# Patient Record
Sex: Female | Born: 1950 | Race: Black or African American | Hispanic: No | Marital: Married | Smoking: Former smoker
Health system: Southern US, Community
[De-identification: ages and names within clinical notes are randomized; demographics above are authoritative.]

## PROBLEM LIST (undated history)

## (undated) DIAGNOSIS — R51 Headache: Secondary | ICD-10-CM

## (undated) DIAGNOSIS — T7840XA Allergy, unspecified, initial encounter: Secondary | ICD-10-CM

## (undated) DIAGNOSIS — M8580 Other specified disorders of bone density and structure, unspecified site: Secondary | ICD-10-CM

## (undated) DIAGNOSIS — I10 Essential (primary) hypertension: Secondary | ICD-10-CM

## (undated) DIAGNOSIS — K219 Gastro-esophageal reflux disease without esophagitis: Secondary | ICD-10-CM

## (undated) DIAGNOSIS — J40 Bronchitis, not specified as acute or chronic: Secondary | ICD-10-CM

## (undated) DIAGNOSIS — D649 Anemia, unspecified: Secondary | ICD-10-CM

## (undated) HISTORY — DX: Headache: R51

## (undated) HISTORY — DX: Anemia, unspecified: D64.9

## (undated) HISTORY — PX: BREAST LUMPECTOMY: SHX2

## (undated) HISTORY — DX: Other specified disorders of bone density and structure, unspecified site: M85.80

## (undated) HISTORY — PX: LAPAROSCOPIC HYSTERECTOMY: SHX1926

## (undated) HISTORY — PX: TUBAL LIGATION: SHX77

## (undated) HISTORY — DX: Essential (primary) hypertension: I10

## (undated) HISTORY — DX: Allergy, unspecified, initial encounter: T78.40XA

## (undated) HISTORY — DX: Gastro-esophageal reflux disease without esophagitis: K21.9

## (undated) HISTORY — DX: Bronchitis, not specified as acute or chronic: J40

---

## 1998-02-18 ENCOUNTER — Other Ambulatory Visit: Admission: RE | Admit: 1998-02-18 | Discharge: 1998-02-18 | Payer: Self-pay | Admitting: *Deleted

## 2001-05-27 ENCOUNTER — Other Ambulatory Visit: Admission: RE | Admit: 2001-05-27 | Discharge: 2001-05-27 | Payer: Self-pay | Admitting: *Deleted

## 2001-12-05 ENCOUNTER — Emergency Department (HOSPITAL_COMMUNITY): Admission: EM | Admit: 2001-12-05 | Discharge: 2001-12-05 | Payer: Self-pay | Admitting: *Deleted

## 2001-12-05 ENCOUNTER — Encounter: Payer: Self-pay | Admitting: *Deleted

## 2003-10-12 ENCOUNTER — Ambulatory Visit (HOSPITAL_COMMUNITY): Admission: RE | Admit: 2003-10-12 | Discharge: 2003-10-12 | Payer: Self-pay | Admitting: Obstetrics & Gynecology

## 2004-09-10 ENCOUNTER — Ambulatory Visit: Payer: Self-pay | Admitting: Gastroenterology

## 2004-09-15 ENCOUNTER — Ambulatory Visit: Payer: Self-pay | Admitting: Gastroenterology

## 2004-10-01 ENCOUNTER — Ambulatory Visit: Payer: Self-pay

## 2004-10-30 ENCOUNTER — Ambulatory Visit: Payer: Self-pay | Admitting: Gastroenterology

## 2004-10-30 ENCOUNTER — Ambulatory Visit: Payer: Self-pay

## 2004-12-10 ENCOUNTER — Ambulatory Visit: Payer: Self-pay | Admitting: Gastroenterology

## 2004-12-17 ENCOUNTER — Ambulatory Visit: Payer: Self-pay | Admitting: Gastroenterology

## 2005-09-09 ENCOUNTER — Other Ambulatory Visit: Admission: RE | Admit: 2005-09-09 | Discharge: 2005-09-09 | Payer: Self-pay | Admitting: Obstetrics and Gynecology

## 2005-10-07 ENCOUNTER — Ambulatory Visit (HOSPITAL_COMMUNITY): Admission: RE | Admit: 2005-10-07 | Discharge: 2005-10-07 | Payer: Self-pay | Admitting: Obstetrics and Gynecology

## 2005-10-08 ENCOUNTER — Encounter: Payer: Self-pay | Admitting: Internal Medicine

## 2005-10-23 ENCOUNTER — Ambulatory Visit: Payer: Self-pay | Admitting: Internal Medicine

## 2005-10-28 ENCOUNTER — Ambulatory Visit: Payer: Self-pay | Admitting: Internal Medicine

## 2005-11-13 ENCOUNTER — Ambulatory Visit: Payer: Self-pay | Admitting: Gastroenterology

## 2005-11-25 ENCOUNTER — Ambulatory Visit: Payer: Self-pay | Admitting: Internal Medicine

## 2005-11-26 ENCOUNTER — Ambulatory Visit: Payer: Self-pay | Admitting: Gastroenterology

## 2005-12-09 ENCOUNTER — Ambulatory Visit: Payer: Self-pay | Admitting: Gastroenterology

## 2005-12-22 ENCOUNTER — Ambulatory Visit: Payer: Self-pay | Admitting: Internal Medicine

## 2006-01-05 ENCOUNTER — Ambulatory Visit: Payer: Self-pay | Admitting: Internal Medicine

## 2006-01-06 ENCOUNTER — Ambulatory Visit: Payer: Self-pay | Admitting: Internal Medicine

## 2006-01-18 ENCOUNTER — Ambulatory Visit: Payer: Self-pay | Admitting: Internal Medicine

## 2006-02-01 ENCOUNTER — Ambulatory Visit: Payer: Self-pay | Admitting: Gastroenterology

## 2006-02-09 ENCOUNTER — Ambulatory Visit: Payer: Self-pay | Admitting: Internal Medicine

## 2006-05-10 ENCOUNTER — Ambulatory Visit: Payer: Self-pay | Admitting: Internal Medicine

## 2006-07-13 ENCOUNTER — Ambulatory Visit: Payer: Self-pay | Admitting: Internal Medicine

## 2006-07-27 ENCOUNTER — Ambulatory Visit: Payer: Self-pay | Admitting: Internal Medicine

## 2006-09-10 ENCOUNTER — Other Ambulatory Visit: Admission: RE | Admit: 2006-09-10 | Discharge: 2006-09-10 | Payer: Self-pay | Admitting: Obstetrics and Gynecology

## 2006-09-13 ENCOUNTER — Ambulatory Visit: Payer: Self-pay | Admitting: Internal Medicine

## 2006-09-13 LAB — CONVERTED CEMR LAB
Basophils Absolute: 0.3 10*3/uL — ABNORMAL HIGH (ref 0.0–0.1)
Basophils Relative: 5.1 % — ABNORMAL HIGH (ref 0.0–1.0)
Eosinophil percent: 2.7 % (ref 0.0–5.0)
HCT: 36.7 % (ref 36.0–46.0)
Hemoglobin: 12 g/dL (ref 12.0–15.0)
Lymphocytes Relative: 41.1 % (ref 12.0–46.0)
MCHC: 32.7 g/dL (ref 30.0–36.0)
MCV: 89.4 fL (ref 78.0–100.0)
Monocytes Absolute: 0.5 10*3/uL (ref 0.2–0.7)
Monocytes Relative: 10.5 % (ref 3.0–11.0)
Neutro Abs: 2.1 10*3/uL (ref 1.4–7.7)
Neutrophils Relative %: 40.6 % — ABNORMAL LOW (ref 43.0–77.0)
Platelets: 458 10*3/uL — ABNORMAL HIGH (ref 150–400)
RBC: 4.11 M/uL (ref 3.87–5.11)
RDW: 16.3 % — ABNORMAL HIGH (ref 11.5–14.6)
WBC: 5.1 10*3/uL (ref 4.5–10.5)

## 2006-10-11 ENCOUNTER — Ambulatory Visit (HOSPITAL_COMMUNITY): Admission: RE | Admit: 2006-10-11 | Discharge: 2006-10-11 | Payer: Self-pay | Admitting: Obstetrics and Gynecology

## 2007-01-13 ENCOUNTER — Ambulatory Visit: Payer: Self-pay | Admitting: Internal Medicine

## 2007-02-24 ENCOUNTER — Ambulatory Visit: Payer: Self-pay | Admitting: Gastroenterology

## 2007-04-25 ENCOUNTER — Ambulatory Visit: Payer: Self-pay | Admitting: Internal Medicine

## 2007-04-25 LAB — CONVERTED CEMR LAB
BUN: 7 mg/dL (ref 6–23)
Basophils Absolute: 0.1 10*3/uL (ref 0.0–0.1)
Basophils Relative: 1.3 % — ABNORMAL HIGH (ref 0.0–1.0)
Creatinine, Ser: 0.6 mg/dL (ref 0.4–1.2)
Eosinophils Relative: 2.9 % (ref 0.0–5.0)
Glucose, Bld: 86 mg/dL (ref 70–99)
Lymphocytes Relative: 27 % (ref 12.0–46.0)
MCV: 91.1 fL (ref 78.0–100.0)
Monocytes Absolute: 0.5 10*3/uL (ref 0.2–0.7)
Monocytes Relative: 7.2 % (ref 3.0–11.0)
Neutro Abs: 4 10*3/uL (ref 1.4–7.7)
Neutrophils Relative %: 61.6 % (ref 43.0–77.0)
Platelets: 458 10*3/uL — ABNORMAL HIGH (ref 150–400)
Potassium: 4.1 meq/L (ref 3.5–5.1)
Pro B Natriuretic peptide (BNP): 169 pg/mL — ABNORMAL HIGH (ref 0.0–100.0)
RDW: 12.6 % (ref 11.5–14.6)
TSH: 1.56 microintl units/mL (ref 0.35–5.50)

## 2007-06-04 ENCOUNTER — Encounter: Payer: Self-pay | Admitting: Internal Medicine

## 2007-06-04 DIAGNOSIS — K219 Gastro-esophageal reflux disease without esophagitis: Secondary | ICD-10-CM

## 2007-06-04 DIAGNOSIS — I10 Essential (primary) hypertension: Secondary | ICD-10-CM

## 2007-06-04 DIAGNOSIS — J309 Allergic rhinitis, unspecified: Secondary | ICD-10-CM | POA: Insufficient documentation

## 2007-07-01 ENCOUNTER — Ambulatory Visit: Payer: Self-pay | Admitting: Internal Medicine

## 2007-11-08 ENCOUNTER — Ambulatory Visit: Payer: Self-pay | Admitting: Internal Medicine

## 2007-11-16 ENCOUNTER — Ambulatory Visit (HOSPITAL_COMMUNITY): Admission: RE | Admit: 2007-11-16 | Discharge: 2007-11-16 | Payer: Self-pay | Admitting: Obstetrics and Gynecology

## 2007-11-21 ENCOUNTER — Ambulatory Visit: Payer: Self-pay | Admitting: Internal Medicine

## 2007-11-22 ENCOUNTER — Other Ambulatory Visit: Admission: RE | Admit: 2007-11-22 | Discharge: 2007-11-22 | Payer: Self-pay | Admitting: Obstetrics and Gynecology

## 2007-11-23 ENCOUNTER — Encounter: Admission: RE | Admit: 2007-11-23 | Discharge: 2007-11-23 | Payer: Self-pay | Admitting: Obstetrics and Gynecology

## 2007-11-24 ENCOUNTER — Telehealth: Payer: Self-pay | Admitting: Internal Medicine

## 2008-01-23 LAB — CONVERTED CEMR LAB: Pap Smear: NORMAL

## 2008-01-27 ENCOUNTER — Ambulatory Visit: Payer: Self-pay | Admitting: Internal Medicine

## 2008-02-01 ENCOUNTER — Ambulatory Visit: Payer: Self-pay | Admitting: Internal Medicine

## 2008-02-01 LAB — CONVERTED CEMR LAB
ALT: 15 units/L (ref 0–35)
AST: 15 units/L (ref 0–37)
BUN: 7 mg/dL (ref 6–23)
GFR calc Af Amer: 112 mL/min
GFR calc non Af Amer: 92 mL/min
Glucose, Bld: 78 mg/dL (ref 70–99)
Sodium: 136 meq/L (ref 135–145)
TSH: 0.87 microintl units/mL (ref 0.35–5.50)

## 2008-02-06 ENCOUNTER — Encounter: Payer: Self-pay | Admitting: Internal Medicine

## 2008-02-06 ENCOUNTER — Ambulatory Visit: Payer: Self-pay

## 2008-02-09 ENCOUNTER — Telehealth: Payer: Self-pay | Admitting: Internal Medicine

## 2008-02-15 ENCOUNTER — Telehealth (INDEPENDENT_AMBULATORY_CARE_PROVIDER_SITE_OTHER): Payer: Self-pay | Admitting: *Deleted

## 2008-03-28 ENCOUNTER — Telehealth: Payer: Self-pay | Admitting: Internal Medicine

## 2008-03-29 ENCOUNTER — Ambulatory Visit: Payer: Self-pay | Admitting: Internal Medicine

## 2008-05-19 ENCOUNTER — Emergency Department (HOSPITAL_COMMUNITY): Admission: EM | Admit: 2008-05-19 | Discharge: 2008-05-19 | Payer: Self-pay | Admitting: Emergency Medicine

## 2008-05-22 ENCOUNTER — Ambulatory Visit (HOSPITAL_COMMUNITY): Admission: RE | Admit: 2008-05-22 | Discharge: 2008-05-22 | Payer: Self-pay | Admitting: Internal Medicine

## 2008-07-03 ENCOUNTER — Telehealth: Payer: Self-pay | Admitting: Internal Medicine

## 2008-09-25 ENCOUNTER — Encounter: Payer: Self-pay | Admitting: Internal Medicine

## 2008-10-03 ENCOUNTER — Ambulatory Visit: Payer: Self-pay | Admitting: Internal Medicine

## 2008-10-03 DIAGNOSIS — K645 Perianal venous thrombosis: Secondary | ICD-10-CM

## 2008-10-25 ENCOUNTER — Encounter: Payer: Self-pay | Admitting: Internal Medicine

## 2008-11-13 ENCOUNTER — Telehealth: Payer: Self-pay | Admitting: Internal Medicine

## 2009-02-05 ENCOUNTER — Encounter: Payer: Self-pay | Admitting: Obstetrics and Gynecology

## 2009-02-05 ENCOUNTER — Ambulatory Visit: Payer: Self-pay | Admitting: Obstetrics and Gynecology

## 2009-02-05 ENCOUNTER — Other Ambulatory Visit: Admission: RE | Admit: 2009-02-05 | Discharge: 2009-02-05 | Payer: Self-pay | Admitting: Obstetrics and Gynecology

## 2009-02-21 ENCOUNTER — Ambulatory Visit: Payer: Self-pay | Admitting: Internal Medicine

## 2009-02-21 DIAGNOSIS — R0989 Other specified symptoms and signs involving the circulatory and respiratory systems: Secondary | ICD-10-CM

## 2009-03-01 ENCOUNTER — Ambulatory Visit: Payer: Self-pay | Admitting: Cardiovascular Disease

## 2009-03-05 ENCOUNTER — Encounter: Admission: RE | Admit: 2009-03-05 | Discharge: 2009-03-05 | Payer: Self-pay | Admitting: Obstetrics and Gynecology

## 2009-03-15 ENCOUNTER — Ambulatory Visit: Payer: Self-pay

## 2009-03-15 ENCOUNTER — Encounter: Payer: Self-pay | Admitting: Internal Medicine

## 2009-03-19 ENCOUNTER — Telehealth: Payer: Self-pay | Admitting: Internal Medicine

## 2009-03-26 LAB — CONVERTED CEMR LAB: Pap Smear: NORMAL

## 2009-06-20 ENCOUNTER — Ambulatory Visit: Payer: Self-pay | Admitting: Obstetrics and Gynecology

## 2009-06-24 ENCOUNTER — Ambulatory Visit: Payer: Self-pay | Admitting: Internal Medicine

## 2009-07-17 ENCOUNTER — Ambulatory Visit: Payer: Self-pay | Admitting: Internal Medicine

## 2009-07-17 DIAGNOSIS — M65839 Other synovitis and tenosynovitis, unspecified forearm: Secondary | ICD-10-CM

## 2009-07-17 DIAGNOSIS — M65849 Other synovitis and tenosynovitis, unspecified hand: Secondary | ICD-10-CM

## 2009-07-23 ENCOUNTER — Telehealth: Payer: Self-pay | Admitting: Internal Medicine

## 2009-07-23 ENCOUNTER — Ambulatory Visit: Payer: Self-pay | Admitting: Internal Medicine

## 2009-07-30 ENCOUNTER — Ambulatory Visit: Payer: Self-pay | Admitting: Internal Medicine

## 2009-09-30 ENCOUNTER — Ambulatory Visit: Payer: Self-pay | Admitting: Gynecology

## 2009-09-30 ENCOUNTER — Telehealth: Payer: Self-pay | Admitting: Internal Medicine

## 2009-10-15 ENCOUNTER — Telehealth: Payer: Self-pay | Admitting: Internal Medicine

## 2009-10-17 ENCOUNTER — Encounter: Payer: Self-pay | Admitting: Internal Medicine

## 2009-10-17 LAB — CONVERTED CEMR LAB
ALT: 12 units/L (ref 0–35)
Albumin: 4.1 g/dL (ref 3.5–5.2)
Alkaline Phosphatase: 92 units/L (ref 39–117)
Bilirubin, Direct: 0.1 mg/dL (ref 0.0–0.3)
Calcium: 10.5 mg/dL (ref 8.4–10.5)
Indirect Bilirubin: 0.2 mg/dL (ref 0.0–0.9)
Sodium: 140 meq/L (ref 135–145)
Total Protein: 7.6 g/dL (ref 6.0–8.3)
Triglycerides: 65 mg/dL (ref <150)
VLDL: 13 mg/dL (ref 0–40)

## 2009-10-21 ENCOUNTER — Ambulatory Visit: Payer: Self-pay | Admitting: Internal Medicine

## 2009-12-09 ENCOUNTER — Ambulatory Visit: Payer: Self-pay | Admitting: Obstetrics and Gynecology

## 2010-02-06 ENCOUNTER — Ambulatory Visit: Payer: Self-pay | Admitting: Obstetrics and Gynecology

## 2010-02-06 ENCOUNTER — Other Ambulatory Visit: Admission: RE | Admit: 2010-02-06 | Discharge: 2010-02-06 | Payer: Self-pay | Admitting: Obstetrics and Gynecology

## 2010-03-07 ENCOUNTER — Encounter: Admission: RE | Admit: 2010-03-07 | Discharge: 2010-03-07 | Payer: Self-pay | Admitting: Obstetrics and Gynecology

## 2010-03-14 ENCOUNTER — Ambulatory Visit: Payer: Self-pay | Admitting: Obstetrics and Gynecology

## 2010-03-17 LAB — HM MAMMOGRAPHY: HM Mammogram: NORMAL

## 2010-04-14 ENCOUNTER — Ambulatory Visit: Payer: Self-pay | Admitting: Internal Medicine

## 2010-04-14 DIAGNOSIS — M79609 Pain in unspecified limb: Secondary | ICD-10-CM

## 2010-04-14 LAB — CONVERTED CEMR LAB
BUN: 13 mg/dL (ref 6–23)
CO2: 27 meq/L (ref 19–32)

## 2010-04-15 ENCOUNTER — Encounter: Payer: Self-pay | Admitting: Internal Medicine

## 2010-04-17 ENCOUNTER — Ambulatory Visit: Payer: Self-pay | Admitting: Obstetrics and Gynecology

## 2010-04-24 ENCOUNTER — Ambulatory Visit: Payer: Self-pay | Admitting: Obstetrics and Gynecology

## 2010-07-15 ENCOUNTER — Ambulatory Visit: Payer: Self-pay | Admitting: Obstetrics and Gynecology

## 2010-07-23 ENCOUNTER — Ambulatory Visit: Payer: Self-pay | Admitting: Internal Medicine

## 2010-07-23 DIAGNOSIS — T148XXA Other injury of unspecified body region, initial encounter: Secondary | ICD-10-CM | POA: Insufficient documentation

## 2010-07-23 DIAGNOSIS — R1310 Dysphagia, unspecified: Secondary | ICD-10-CM | POA: Insufficient documentation

## 2010-07-23 DIAGNOSIS — F438 Other reactions to severe stress: Secondary | ICD-10-CM

## 2010-07-23 LAB — CONVERTED CEMR LAB
BUN: 13 mg/dL (ref 6–23)
Basophils Absolute: 0 10*3/uL (ref 0.0–0.1)
CO2: 30 meq/L (ref 19–32)
Calcium: 10.6 mg/dL — ABNORMAL HIGH (ref 8.4–10.5)
Glucose, Bld: 93 mg/dL (ref 70–99)
HCT: 40.2 % (ref 36.0–46.0)
INR: 0.87 (ref <1.50)
Lymphocytes Relative: 40 % (ref 12–46)
Lymphs Abs: 2.7 10*3/uL (ref 0.7–4.0)
MCV: 88.7 fL (ref 78.0–100.0)
Neutrophils Relative %: 48 % (ref 43–77)
Platelets: 558 10*3/uL — ABNORMAL HIGH (ref 150–400)
Sodium: 137 meq/L (ref 135–145)
WBC: 6.6 10*3/uL (ref 4.0–10.5)

## 2010-07-24 ENCOUNTER — Ambulatory Visit (HOSPITAL_BASED_OUTPATIENT_CLINIC_OR_DEPARTMENT_OTHER): Admission: RE | Admit: 2010-07-24 | Discharge: 2010-07-24 | Payer: Self-pay | Admitting: Internal Medicine

## 2010-07-24 ENCOUNTER — Telehealth: Payer: Self-pay | Admitting: Internal Medicine

## 2010-07-24 ENCOUNTER — Ambulatory Visit: Payer: Self-pay | Admitting: Diagnostic Radiology

## 2010-09-23 ENCOUNTER — Ambulatory Visit: Payer: Self-pay | Admitting: Internal Medicine

## 2010-09-23 DIAGNOSIS — R252 Cramp and spasm: Secondary | ICD-10-CM

## 2010-10-20 ENCOUNTER — Ambulatory Visit: Payer: Self-pay | Admitting: Internal Medicine

## 2010-11-21 ENCOUNTER — Ambulatory Visit
Admission: RE | Admit: 2010-11-21 | Discharge: 2010-11-21 | Payer: Self-pay | Source: Home / Self Care | Attending: Gynecology | Admitting: Gynecology

## 2010-11-30 ENCOUNTER — Encounter: Payer: Self-pay | Admitting: Obstetrics and Gynecology

## 2010-12-09 NOTE — Assessment & Plan Note (Signed)
Summary: 2 month follow up/mhf   Vital Signs:  Patient profile:   60 year old female Height:      67 inches Weight:      165 pounds BMI:     25.94 O2 Sat:      100 % on Room air Temp:     98.1 degrees F oral Pulse rate:   80 / minute Resp:     18 per minute BP sitting:   130 / 70  (right arm) Cuff size:   regular  Vitals Entered By: Glendell Docker CMA (September 23, 2010 8:11 AM)  O2 Flow:  Room air CC: 2 month Follow up Is Patient Diabetic? No Pain Assessment Patient in pain? no      Comments c/o cramping in lower legs and feet  during sleep    Primary Care Provider:  Dondra Spry DO  CC:  2 month Follow up.  History of Present Illness: 60 y/o AA female for f/u dysphagia improved pt using prilosec daily  Htn -  wakes up with leg cramps (both legs) no trigger no exertional symptoms  labs reviewed she has hx of low iron RDW elevated mild thrombocytosis  Preventive Screening-Counseling & Management  Alcohol-Tobacco     Smoking Status: quit  Allergies: 1)  ! Pcn 2)  ! 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   Lungs:  normal respiratory effort and normal breath sounds.   Heart:  normal rate, regular rhythm, and no gallop.   Pulses:  dorsalis pedis and posterior tibial pulses are full and equal bilaterally Extremities:  No lower extremity edema no calf redness   Impression & Recommendations:  Problem # 1:  DYSPHAGIA UNSPECIFIED (ICD-787.20) Assessment Improved Likely due to reflux.   CT of neck negative continue PPI for now  pt will try transitioning to OTC zantac antireflux  measures reviewed  Problem # 2:  HYPERTENSION (ICD-401.9) Assessment: Unchanged  Her updated medication list for this problem includes:    Diovan Hct 320-25 Mg Tabs (Valsartan-hydrochlorothiazide) ..... One by mouth once daily  BP today: 130/70 Prior BP: 110/72 (07/23/2010)  Labs Reviewed: K+: 4.0 (07/23/2010) Creat: : 0.80 (07/23/2010)   Chol: 183 (10/17/2009)   HDL: 75 (10/17/2009)   LDL: 95 (10/17/2009)   TG: 65 (10/17/2009)  Problem # 3:  LEG CRAMPS (ICD-729.82) probable iron def pt will try iron supplement  Complete Medication List: 1)  Diovan Hct 320-25 Mg Tabs (Valsartan-hydrochlorothiazide) .... One by mouth once daily 2)  Prilosec 20 Mg Cpdr (Omeprazole) .... By mouth once daily before meals 3)  Triamcinolone Acetonide 0.5 % Crea (Triamcinolone acetonide) .... Apply to affected area two times a day as needed 4)  Zolpidem Tartrate 5 Mg Tabs (Zolpidem tartrate) .... One by mouth at bedtime prn  Patient Instructions: 1)  Please schedule a follow-up appointment in 6 months. 2)  BMP prior to visit, ICD-9: 401.9 3)  Lipid Panel prior to visit, ICD-9: 401.9 4)  TSH prior to visit, ICD-9: 401.9 5)  High sensitivity CRP :  401.9 6)  Please return for lab work in December.   Orders Added: 1)  Est. Patient Level III [29562]    Current Allergies (reviewed today): ! PCN ! ACE INHIBITORS

## 2010-12-09 NOTE — Letter (Signed)
   [place] at [facility] [address] [place], [place]  [address]  [place] Phone: [phone]      April 15, 2010   Shannon [name] [address] [place], [place] [address]  RE:  LAB RESULTS  Dear  [name],  The following is an interpretation of your most recent lab tests.  Please take note of any instructions provided or changes to medications that have resulted from your lab work.  ELECTROLYTES:  Good - no changes needed  KIDNEY FUNCTION TESTS:  Good - no changes needed          Sincerely Yours,    Dr. Thomos Lemons

## 2010-12-09 NOTE — Assessment & Plan Note (Signed)
Summary: 1 MONTH FOLLOW UP/MHF   Vital Signs:  Patient profile:   60 year old Shannon [name] Weight:      170.50 pounds BMI:     26.80 O2 Sat:      100 % on Room air Temp:     98.0 degrees F oral Pulse rate:   86 / minute Pulse rhythm:   regular Resp:     18 per minute BP sitting:   110 / 72  (right arm) Cuff size:   large  Vitals Entered By: Glendell Docker CMA (July 23, 2010 8:17 AM)  O2 Flow:  Room air CC: 1 month follow up  Is Patient Diabetic? No Pain Assessment Patient in pain? no        Contraindications/Deferment of Procedures/Staging:    Test/Procedure: FLU VAX    Reason for deferment: patient declined   Primary Care Provider:  DThomos Lemons DO  CC:  1 month follow up .  History of Present Illness: 60 y/o AA Shannon [name] for f/u pt report abnormal throat sensation after eating first bite "stuck sensation".  It does not matter if liquid or solid  pt working evening shift - motivated by extra money trouble sleeping during the day she is easilty agitated.  does not want be around her family / husband she is sole income earner for household chronic fatigue  Past History:  Past Medical History: Allergic rhinitis GERD  Headache     Hypertension        Past Surgical History: Lumpectomy  Tubal ligation          Family History: Mother, age 41, has hypertension. Father deceased at age 49 secondary to Alzheimer disease. Patient is an only child and has no siblings.          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   Head:  normocephalic and atraumatic.   Eyes:  no conjunctival petechiae Mouth:  pharynx pink and moist.   Neck:  No deformities, masses, or tenderness noted. Lungs:  normal respiratory  effort and normal breath sounds.   Heart:  normal rate, regular rhythm, and no gallop.   Abdomen:  soft, non-tender, normal bowel sounds, and no masses.   Extremities:  No lower extremity edema Neurologic:  cranial nerves II-XII intact and gait normal.   Psych:  good eye contact and tearful.     Impression & Recommendations:  Problem # 1:  CONTUSION (ICD-924.9) unexplained bruising.  rule out coagulopathy or thrombocytopenia  Orders: T-CBC w/Diff ([id number]) T-Protime, Auto ([id number])  Problem # 2:  DYSPHAGIA UNSPECIFIED (ICD-787.20) abnormal throat sensation.  swallowing is normal.  rule out neck mass  Orders: Radiology Referral (Radiology)  Problem # 3:  HYPERTENSION (ICD-401.9) Assessment: Unchanged  Her updated medication list for this problem includes:    Diovan Hct 320-25 Mg Tabs (Valsartan-hydrochlorothiazide) ..... One by mouth once daily  Orders: T-Basic Metabolic Panel [id number])  BP today: 110/72 Prior BP: 122/70 (04/14/2010)  Labs Reviewed: K+: 4.4 (04/14/2010) Creat: : 0.76 (04/14/2010)   Chol: 183 (10/17/2009)   HDL: Shannon (10/17/2009)   LDL: 95 (10/17/2009)   TG: 65 (10/17/2009)  Problem # 4:  OTHER ACUTE REACTIONS TO STRESS (ICD-308.3) pt feels stressed after starting  overnight shift.  this has happened before.  Pt gets irritable and gains wt.  use ambien as needed.  pt encouraged to change back to daytime shift  Complete Medication List: 1)  Diovan Hct 320-25 Mg Tabs (Valsartan-hydrochlorothiazide) .... One by mouth once daily 2)  Prilosec 20 Mg Cpdr (Omeprazole) .... By mouth once daily before meals 3)  Triamcinolone Acetonide 0.5 % Crea (Triamcinolone acetonide) .... Apply to affected area two times a day as needed 4)  Zolpidem Tartrate 5 Mg Tabs (Zolpidem tartrate) .... One by mouth at bedtime prn  Patient Instructions: 1)  Please schedule a follow-up appointment in 2 months. Prescriptions: ZOLPIDEM TARTRATE 5 MG TABS (ZOLPIDEM TARTRATE) one  by mouth at bedtime prn  #30 x 2   Entered and Authorized by:   D. Shannon Lemons DO   Signed by:   D. Shannon Lemons DO on 07/23/2010   Method used:   Print then Give to Patient   RxID:   [id number]    Immunization History:  Influenza Immunization History:    Influenza:  declined (07/23/2010)

## 2010-12-09 NOTE — Progress Notes (Signed)
  Phone Note Outgoing Call   Summary of Call: LMOV for pt to call back re:  test results Initial call taken by: D. Thomos Lemons DO,  July 24, 2010 4:53 PM  Follow-up for Phone Call        Pt called back, she can be reached at [phone] Diane Tomerlin  July 25, 2010 4:38 PM  Additional Follow-up for Phone Call Additional follow up Details #1::        pt notified of test results Additional Follow-up by: D. Thomos Lemons DO,  July 25, 2010 5:27 PM

## 2010-12-09 NOTE — Assessment & Plan Note (Signed)
Summary: 6 MONTH FOLLOW UP/MHF   Vital Signs:  Patient profile:   60 year old female Height:      67 inches Weight:      168.75 pounds BMI:     26.53 O2 Sat:      99 % on Room air Temp:     98.4 degrees F oral Pulse rate:   86 / minute Pulse rhythm:   regular Resp:     18 per minute BP sitting:   122 / 70  (right arm) Cuff size:   regular  Vitals Entered By: Glendell Docker CMA (April 14, 2010 8:21 AM)  O2 Flow:  Room air CC: Rm 3 6 Month Follow up Comments left thumb discomfort at the joint, random bruising daily- does not take aspirin   Primary Care Provider:  Dondra Spry DO  CC:  Rm 3 6 Month Follow up.  History of Present Illness: Hypertension Follow-Up      This is a 60 year old woman who presents for Hypertension follow-up.  The patient denies lightheadedness and edema.  The patient denies the following associated symptoms: chest pain.  Compliance with medications (by patient report) has been near 100%.  The patient reports that dietary compliance has been fair.    started to work evening shift - better pay  ( 11 pm - 7 AM shift )  Preventive Screening-Counseling & Management  Alcohol-Tobacco     Smoking Status: quit  Allergies: 1)  ! Pcn 2)  ! Ace Inhibitors  Past History:  Past Medical History: Allergic rhinitis GERD  Headache    Hypertension        Past Surgical History: Lumpectomy  Tubal ligation         Family History: Mother, age 95, has hypertension. Father deceased at age 104 secondary to Alzheimer disease. Patient is an only child and has no siblings.         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   Lungs:  normal respiratory effort and normal breath sounds.   Heart:  normal rate, regular rhythm, and no gallop.   Extremities:  No lower extremity  edema   Impression & Recommendations:  Problem # 1:  HYPERTENSION (ICD-401.9) stable.  Maintain current medication regimen.  The following medications were removed from the medication list:    Amlodipine Besylate 10 Mg Tabs (Amlodipine besylate) ..... One by mouth once daily Her updated medication list for this problem includes:    Diovan Hct 320-25 Mg Tabs (Valsartan-hydrochlorothiazide) ..... One by mouth once daily  Orders: T-Basic Metabolic Panel [id number])  BP today: 122/70 Prior BP: 112/74 (10/21/2009)  Labs Reviewed: K+: 4.1 (10/17/2009) Creat: : 0.72 (10/17/2009)   Chol: 183 (10/17/2009)   HDL: 75 (10/17/2009)   LDL: 95 (10/17/2009)   TG: 65 (10/17/2009)  Problem # 2:  THUMB PAIN, LEFT (ICD-729.5) pt works in lab assoc with repetitive motion.  discomfort despite topical anti inflammatory.  refer to Dr. Teressa Senter for further eval and tx Orders: Orthopedic Referral (Ortho)  Complete Medication List: 1)  Diovan Hct 320-25 Mg Tabs (Valsartan-hydrochlorothiazide) .... One by mouth once daily 2)  Prilosec 20 Mg Cpdr (Omeprazole) .... By mouth once daily before meals 3)  Triamcinolone Acetonide 0.5 % Crea (Triamcinolone acetonide) .... Apply to affected area two times  a day as needed 4)  Zolpidem Tartrate 5 Mg Tabs (Zolpidem tartrate) .... One by mouth at bedtime prn  Patient Instructions: 1)  Please schedule a follow-up appointment in 6 months. Prescriptions: ZOLPIDEM TARTRATE 5 MG TABS (ZOLPIDEM TARTRATE) one by mouth at bedtime prn  #30 x 3   Entered and Authorized by:   D. Thomos Lemons DO   Signed by:   D. Thomos Lemons DO on 04/14/2010   Method used:   Print then Give to Patient   RxID:   [id number] AMLODIPINE BESYLATE 10 MG  TABS (AMLODIPINE BESYLATE) one by mouth once daily  #90 x 3   Entered and Authorized by:   D. Thomos Lemons DO   Signed by:   D. Thomos Lemons DO on 04/14/2010   Method used:   Electronically to        [id number]* (retail)        [address]       [place], [place]  [address]       Ph: [phone] or [phone]       Fax: [phone]   RxID:   [id number] DIOVAN HCT 320-25 MG TABS (VALSARTAN-HYDROCHLOROTHIAZIDE) one by mouth once daily  #90 x 3   Entered and Authorized by:   D. Thomos Lemons DO   Signed by:   D. Thomos Lemons DO on 04/14/2010   Method used:   Electronically to        [id number]* (retail)       [address]       [place], [place]  [address]       Ph: [phone] or [phone]       Fax: [phone]   RxID:   [id number]   Current Allergies (reviewed today): ! PCN ! ACE INHIBITORS   Preventive Care Screening  Mammogram:    Date:  03/17/2010    Results:  normal   Pap Smear:    Date:  03/11/2010    Results:  normal    Preventive Care Screening  Mammogram:    Date:  03/17/2010    Results:  normal   Pap Smear:    Date:  03/11/2010    Results:  normal

## 2011-02-09 ENCOUNTER — Telehealth: Payer: Self-pay | Admitting: Internal Medicine

## 2011-02-09 DIAGNOSIS — G4700 Insomnia, unspecified: Secondary | ICD-10-CM

## 2011-02-09 NOTE — Telephone Encounter (Signed)
Ok to refill x 3 

## 2011-02-09 NOTE — Telephone Encounter (Signed)
Refill-zolpidem tartrate 5mg  tablet. Take 1 tablet by mouth at bedtime if needed for sleep. Qty 30. Last fill 1.27.12.

## 2011-02-10 MED ORDER — ZOLPIDEM TARTRATE ER 12.5 MG PO TBCR: 12.5000 mg | EXTENDED_RELEASE_TABLET | Freq: Every evening | ORAL | Status: DC | PRN

## 2011-02-10 NOTE — Telephone Encounter (Signed)
Rx refill called to [place] at [facility] on [address]

## 2011-02-28 ENCOUNTER — Encounter: Payer: Self-pay | Admitting: Internal Medicine

## 2011-03-03 ENCOUNTER — Ambulatory Visit (INDEPENDENT_AMBULATORY_CARE_PROVIDER_SITE_OTHER): Payer: BC Managed Care – PPO | Admitting: Internal Medicine

## 2011-03-03 ENCOUNTER — Encounter: Payer: Self-pay | Admitting: Internal Medicine

## 2011-03-03 VITALS — BP 130/60 | HR 81 | Temp 98.1°F | Resp 18 | Wt 164.0 lb

## 2011-03-03 DIAGNOSIS — R21 Rash and other nonspecific skin eruption: Secondary | ICD-10-CM

## 2011-03-03 DIAGNOSIS — L309 Dermatitis, unspecified: Secondary | ICD-10-CM

## 2011-03-03 DIAGNOSIS — I10 Essential (primary) hypertension: Secondary | ICD-10-CM

## 2011-03-03 DIAGNOSIS — T148XXA Other injury of unspecified body region, initial encounter: Secondary | ICD-10-CM

## 2011-03-03 DIAGNOSIS — L259 Unspecified contact dermatitis, unspecified cause: Secondary | ICD-10-CM

## 2011-03-03 MED ORDER — CLOBETASOL PROPIONATE 0.05 % EX CREA: TOPICAL_CREAM | Freq: Two times a day (BID) | CUTANEOUS | Status: AC

## 2011-03-03 NOTE — Progress Notes (Signed)
  Subjective:    Patient ID: Shannon [name], female    DOB: May 07, 1951, 60 y.o.   MRN: [id number]  HPI  60 y/o AA female for f/u re:  Htn.  Good med compliance.  Denies dizzines.  No chest pains.  C/o chronic fatigue. She currently working thirst shift but pt plans on switching to day shift.  She also c/o rash on chest.  No know environmental trigger.   Review of Systems No tongue or lip swelling.  She also notes easy bruising  Past Medical History  Diagnosis Date  . Allergy   . GERD (gastroesophageal reflux disease)   . Hypertension   . Headache     History   Social History  . Marital Status: Married    Spouse Name: N/A    Number of Children: N/A  . Years of Education: N/A   Occupational History  . Not on file.   Social History Main Topics  . Smoking status: Never Smoker   . Smokeless tobacco: Not on file  . Alcohol Use: No  . Drug Use: Not on file  . Sexually Active: Not on file   Other Topics Concern  . 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 Breast lumpectomy   . Tubal ligation     Family History  Problem Relation Age of Onset  . Hypertension Mother     age 34  . Alzheimer's disease Father     deceased secondary to alzheimer's disease    Allergies  Allergen Reactions  . Ace Inhibitors   . Penicillins     Current Outpatient Prescriptions on File Prior to Visit  Medication Sig Dispense Refill  . omeprazole (PRILOSEC) 20 MG capsule Take 20 mg by mouth daily.        . valsartan-hydrochlorothiazide (DIOVAN-HCT) 320-25 MG per tablet Take 1 tablet by mouth daily.        Marland Kitchen zolpidem (AMBIEN CR) 12.5 MG CR tablet Take 1 tablet (12.5 mg total) by mouth at bedtime as needed for sleep.  30 tablet  3    BP 130/60  Pulse 81  Temp(Src) 98.1 F (36.7 C) (Oral)  Resp 18  Wt  164 lb (74.39 kg)  SpO2 100%       Objective:   Physical Exam  Constitutional: She appears well-developed and well-nourished.  Neck: Neck supple.  Cardiovascular: Normal rate, regular rhythm and normal heart sounds.   Pulmonary/Chest: Effort normal and breath sounds normal. She has no rales.  Skin:       Scattered tiny papules left upper pectoris area.  Excoriated areas.  Resolving bruise right shin          Assessment & Plan:

## 2011-03-03 NOTE — Patient Instructions (Addendum)
Please call our office if your symptoms do not improve or gets worse. We will contact you re: blood test results Use can try taking allegra 180 mg once daily over the counter to help with pruritus.

## 2011-03-04 LAB — CBC WITH DIFFERENTIAL/PLATELET
Basophils Absolute: 0 10*3/uL (ref 0.0–0.1)
Eosinophils Absolute: 0.1 10*3/uL (ref 0.0–0.7)
MCH: 28.7 pg (ref 26.0–34.0)
MCHC: 31.7 g/dL (ref 30.0–36.0)
Monocytes Relative: 8 % (ref 3–12)
Neutrophils Relative %: 54 % (ref 43–77)
RBC: 4.57 MIL/uL (ref 3.87–5.11)
RDW: 14.1 % (ref 11.5–15.5)
WBC: 5.6 10*3/uL (ref 4.0–10.5)

## 2011-03-05 ENCOUNTER — Other Ambulatory Visit: Payer: Self-pay | Admitting: Internal Medicine

## 2011-03-05 DIAGNOSIS — D473 Essential (hemorrhagic) thrombocythemia: Secondary | ICD-10-CM

## 2011-03-23 ENCOUNTER — Ambulatory Visit: Payer: Self-pay | Admitting: Internal Medicine

## 2011-03-23 DIAGNOSIS — R21 Rash and other nonspecific skin eruption: Secondary | ICD-10-CM | POA: Insufficient documentation

## 2011-03-23 NOTE — Assessment & Plan Note (Signed)
Well controlled.  No change in meds BP: 130/60 mmHg  Lab Results  Component Value Date   CREATININE 0.80 07/23/2010

## 2011-03-23 NOTE — Assessment & Plan Note (Signed)
Pt with localized scattered tiny maculpapular rash on chest of unclear etiology Use OTC antihistamines and OTC hydrocortisone. Patient advised to call office if symptoms persist or worsen.

## 2011-03-23 NOTE — Assessment & Plan Note (Signed)
Pt has hx mild thrombocytosis.  Monitor CBCD

## 2011-03-27 NOTE — Assessment & Plan Note (Signed)
Shannon [name]                         GASTROENTEROLOGY OFFICE NOTE   NAME:[name], [name]                       MRN:          [id number]  DATE:02/24/2007                            DOB:          27-May-1951    PROBLEM:  Chest and upper abdominal discomfort.   Shannon [name] has returned for re-evaluation.  Over the past 3 weeks she  has been complaining of immediate post prandial upper abdominal  discomfort with burning chest pain.  Pain may radiate to her back and to  her right neck.  She has been taking Protonix and Prilosec and Tagamet  for this.  She is on no gastric irritants including nonsteroidals.  She  denies dysphagia or odynophagia.  Endoscopy in November 2005 was normal.   Other medications include Micardis, Elavil, and iron plus.   PHYSICAL EXAMINATION:  Pulse 80, blood pressure 122/82, weight 163.  HEENT: EOMI. PERRLA. Sclerae are anicteric.  Conjunctivae are pink.  NECK:  Supple without thyromegaly, adenopathy or carotid bruits.  CHEST:  Clear to auscultation and percussion without adventitious  sounds.  CARDIAC:  Regular rhythm; normal S1 S2.  There are no murmurs, gallops  or rubs.  ABDOMEN:  Bowel sounds are normoactive.  Abdomen is soft, non-tender and  non-distended.  There are no abdominal masses, tenderness, splenic  enlargement or hepatomegaly.  EXTREMITIES:  Full range of motion.  No cyanosis, clubbing or edema.  RECTAL:  Deferred.   IMPRESSION:  Post prandial upper abdominal and chest pain.  Symptoms are  suggestive of acid reflux, despite her medications.  Gallbladder is less  likely, though a consideration.   RECOMMENDATION:  Trial of Zegerid 40 mg daily with antacids p.r.n.  If  symptoms are not improved after 3 or 4 days, I will proceed with  endoscopy and obtain an abdominal ultrasound.     Shannon [name]. Shannon Dice, MD,FACG  Electronically Signed    RDK/MedQ  DD: 02/24/2007  DT: 02/24/2007  Job #: [id number]

## 2011-04-01 ENCOUNTER — Other Ambulatory Visit: Payer: Self-pay | Admitting: Obstetrics and Gynecology

## 2011-04-01 DIAGNOSIS — Z1231 Encounter for screening mammogram for malignant neoplasm of breast: Secondary | ICD-10-CM

## 2011-04-16 ENCOUNTER — Ambulatory Visit (HOSPITAL_COMMUNITY): Payer: BC Managed Care – PPO

## 2011-04-24 ENCOUNTER — Ambulatory Visit
Admission: RE | Admit: 2011-04-24 | Discharge: 2011-04-24 | Disposition: A | Discharge status: Home / Self Care | Payer: BC Managed Care – PPO | Source: Ambulatory Visit | Attending: Obstetrics and Gynecology | Admitting: Obstetrics and Gynecology

## 2011-04-24 ENCOUNTER — Ambulatory Visit (HOSPITAL_COMMUNITY): Payer: BC Managed Care – PPO

## 2011-04-24 DIAGNOSIS — Z1231 Encounter for screening mammogram for malignant neoplasm of breast: Secondary | ICD-10-CM

## 2011-04-27 ENCOUNTER — Ambulatory Visit (INDEPENDENT_AMBULATORY_CARE_PROVIDER_SITE_OTHER): Payer: BC Managed Care – PPO | Admitting: Obstetrics and Gynecology

## 2011-04-27 ENCOUNTER — Ambulatory Visit: Payer: BC Managed Care – PPO | Admitting: Obstetrics and Gynecology

## 2011-04-27 ENCOUNTER — Other Ambulatory Visit: Payer: BC Managed Care – PPO

## 2011-04-27 DIAGNOSIS — R1031 Right lower quadrant pain: Secondary | ICD-10-CM

## 2011-04-27 DIAGNOSIS — N949 Unspecified condition associated with female genital organs and menstrual cycle: Secondary | ICD-10-CM

## 2011-04-27 DIAGNOSIS — D259 Leiomyoma of uterus, unspecified: Secondary | ICD-10-CM

## 2011-05-02 ENCOUNTER — Other Ambulatory Visit: Payer: Self-pay | Admitting: Internal Medicine

## 2011-05-04 NOTE — Telephone Encounter (Signed)
Rx refill sent to pharmacy. 

## 2011-06-02 ENCOUNTER — Ambulatory Visit: Payer: BC Managed Care – PPO | Admitting: Internal Medicine

## 2011-06-04 ENCOUNTER — Ambulatory Visit (INDEPENDENT_AMBULATORY_CARE_PROVIDER_SITE_OTHER): Payer: BC Managed Care – PPO | Admitting: Internal Medicine

## 2011-06-04 ENCOUNTER — Encounter: Payer: Self-pay | Admitting: Internal Medicine

## 2011-06-04 VITALS — BP 100/70 | HR 72 | Temp 98.4°F | Resp 16 | Ht 67.0 in | Wt 166.0 lb

## 2011-06-04 DIAGNOSIS — Z79899 Other long term (current) drug therapy: Secondary | ICD-10-CM

## 2011-06-04 DIAGNOSIS — R21 Rash and other nonspecific skin eruption: Secondary | ICD-10-CM

## 2011-06-04 DIAGNOSIS — G4700 Insomnia, unspecified: Secondary | ICD-10-CM

## 2011-06-04 DIAGNOSIS — D473 Essential (hemorrhagic) thrombocythemia: Secondary | ICD-10-CM

## 2011-06-04 DIAGNOSIS — I10 Essential (primary) hypertension: Secondary | ICD-10-CM

## 2011-06-04 DIAGNOSIS — R7989 Other specified abnormal findings of blood chemistry: Secondary | ICD-10-CM

## 2011-06-04 LAB — CBC WITH DIFFERENTIAL/PLATELET
Basophils Relative: 0 % (ref 0–1)
Eosinophils Absolute: 0.1 10*3/uL (ref 0.0–0.7)
HCT: 38.3 % (ref 36.0–46.0)
Hemoglobin: 12.3 g/dL (ref 12.0–15.0)
Lymphs Abs: 2.5 10*3/uL (ref 0.7–4.0)
MCH: 29.2 pg (ref 26.0–34.0)
MCHC: 32.1 g/dL (ref 30.0–36.0)
MCV: 91 fL (ref 78.0–100.0)
Monocytes Absolute: 0.5 10*3/uL (ref 0.1–1.0)
Monocytes Relative: 8 % (ref 3–12)
Neutrophils Relative %: 50 % (ref 43–77)
RBC: 4.21 MIL/uL (ref 3.87–5.11)

## 2011-06-04 LAB — BASIC METABOLIC PANEL
BUN: 12 mg/dL (ref 6–23)
CO2: 25 mEq/L (ref 19–32)
Calcium: 11.3 mg/dL — ABNORMAL HIGH (ref 8.4–10.5)
Glucose, Bld: 89 mg/dL (ref 70–99)
Potassium: 3.9 mEq/L (ref 3.5–5.3)
Sodium: 134 mEq/L — ABNORMAL LOW (ref 135–145)

## 2011-06-04 MED ORDER — ZOLPIDEM TARTRATE ER 12.5 MG PO TBCR: 12.5000 mg | EXTENDED_RELEASE_TABLET | Freq: Every evening | ORAL | Status: DC | PRN

## 2011-06-04 MED ORDER — VALSARTAN-HYDROCHLOROTHIAZIDE 320-25 MG PO TABS: 1.0000 | ORAL_TABLET | Freq: Every day | ORAL | Status: DC

## 2011-06-04 NOTE — Assessment & Plan Note (Signed)
Normotensive and stable. Continue current regimen. Monitor blood pressure as an outpatient and followup in clinic as scheduled. Obtain chem 7

## 2011-06-04 NOTE — Assessment & Plan Note (Signed)
Likely reactive. Obtain CBC

## 2011-06-04 NOTE — Assessment & Plan Note (Signed)
Refill steroid cream. Discourage prolonged use

## 2011-06-04 NOTE — Progress Notes (Signed)
  Subjective:    Patient ID: Shannon [name], female    DOB: [birth year], 60 y.o.   MRN: [id number]  HPI Patient presents to clinic for evaluation of multiple medical problems. Suffers from chronic insomnia improved with Ambien CR. Request refill. Denies adverse effect. Has chronic intermittent right deltoid rash which improves with steroid cream. No prolonged use. Home blood pressure typically 110-120s and tolerates Diovan HCT without cough or polyuria. Past history of mildly elevated platelet count without neurologic symptoms. No other complaints  Reviewed past medical history, medications and allergies  Review of Systems see history of present illness     Objective:   Physical Exam  Physical Exam  Vitals reviewed. Constitutional:  appears well-developed and well-nourished. No distress.  HENT:  Head: Normocephalic and atraumatic.  Nose: Nose normal.  Eyes: Conjunctivae clear. Right eye exhibits no discharge. Left eye exhibits no discharge. No scleral icterus.  Neck: Neck supple. No thyromegaly present.  Cardiovascular: Normal rate, regular rhythm and normal heart sounds.  Exam reveals no gallop and no friction rub.   No murmur heard. Pulmonary/Chest: Effort normal and breath sounds normal. No respiratory distress.  has no wheezes.  has no rales.  Lymphadenopathy:   no cervical adenopathy.  Neurological:  is alert.  Skin: Skin is warm and dry.  not diaphoretic.  Psychiatric: normal mood and affect.        Assessment & Plan:   No problem-specific assessment & plan notes found for this encounter.

## 2011-06-04 NOTE — Assessment & Plan Note (Signed)
Stable. Refill Ambien CR

## 2011-06-12 ENCOUNTER — Other Ambulatory Visit: Payer: Self-pay | Admitting: Internal Medicine

## 2011-06-15 ENCOUNTER — Telehealth: Payer: Self-pay | Admitting: *Deleted

## 2011-06-15 MED ORDER — VALSARTAN 320 MG PO TABS: 320.0000 mg | ORAL_TABLET | Freq: Every day | ORAL | Status: DC

## 2011-06-15 NOTE — Telephone Encounter (Signed)
Call placed to patient regarding lab results, and medication change. See lab note 06/15/2011.

## 2011-07-15 ENCOUNTER — Other Ambulatory Visit: Payer: Self-pay | Admitting: Internal Medicine

## 2011-07-20 ENCOUNTER — Telehealth: Payer: Self-pay | Admitting: Internal Medicine

## 2011-07-20 DIAGNOSIS — G4700 Insomnia, unspecified: Secondary | ICD-10-CM

## 2011-07-20 MED ORDER — ZOLPIDEM TARTRATE ER 12.5 MG PO TBCR: 12.5000 mg | EXTENDED_RELEASE_TABLET | Freq: Every evening | ORAL | Status: DC | PRN

## 2011-07-20 NOTE — Telephone Encounter (Signed)
Call placed to [facility] [phone]; spoke with Olegario Messier. Verbal order provided for Ambien refill.

## 2011-07-20 NOTE — Telephone Encounter (Signed)
Zolpidem tartrate 5 mg table take 1 tablet as needed at bedtime for sleep qty 30 last fill 05/26/2011

## 2011-07-20 NOTE — Telephone Encounter (Signed)
Ok rf 1

## 2011-08-06 LAB — URINALYSIS, ROUTINE W REFLEX MICROSCOPIC
Glucose, UA: NEGATIVE
Nitrite: NEGATIVE
Protein, ur: NEGATIVE
Urobilinogen, UA: 0.2

## 2011-08-06 LAB — COMPREHENSIVE METABOLIC PANEL
ALT: 75 — ABNORMAL HIGH
AST: 206 — ABNORMAL HIGH
Albumin: 3.1 — ABNORMAL LOW
Alkaline Phosphatase: 110
Chloride: 105
GFR calc Af Amer: >60
Potassium: 3.6
Sodium: 138
Total Bilirubin: 0.5

## 2011-08-06 LAB — URINE MICROSCOPIC-ADD ON

## 2011-08-06 LAB — DIFFERENTIAL
Basophils Absolute: 0
Basophils Relative: 0
Eosinophils Relative: 2
Lymphocytes Relative: 9 — ABNORMAL LOW
Monocytes Absolute: 0.7

## 2011-08-06 LAB — CBC
Platelets: 565 — ABNORMAL HIGH
WBC: 8

## 2011-08-06 LAB — URINE CULTURE: Culture: NO GROWTH

## 2011-08-06 LAB — OCCULT BLOOD X 1 CARD TO LAB, STOOL: Fecal Occult Bld: NEGATIVE

## 2011-08-21 ENCOUNTER — Encounter: Payer: Self-pay | Admitting: Family Medicine

## 2011-08-21 ENCOUNTER — Ambulatory Visit (INDEPENDENT_AMBULATORY_CARE_PROVIDER_SITE_OTHER): Payer: BC Managed Care – PPO | Admitting: Family Medicine

## 2011-08-21 VITALS — BP 150/100 | Temp 98.5°F | Wt 166.0 lb

## 2011-08-21 DIAGNOSIS — R51 Headache: Secondary | ICD-10-CM

## 2011-08-21 DIAGNOSIS — M546 Pain in thoracic spine: Secondary | ICD-10-CM

## 2011-08-21 DIAGNOSIS — M549 Dorsalgia, unspecified: Secondary | ICD-10-CM

## 2011-08-21 MED ORDER — CYCLOBENZAPRINE HCL 5 MG PO TABS: 5.0000 mg | ORAL_TABLET | Freq: Three times a day (TID) | ORAL | Status: AC | PRN
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
## 2011-08-21 NOTE — Patient Instructions (Signed)
Try moist heat and muscle massage to neck and upper back.

## 2011-09-10 ENCOUNTER — Encounter: Payer: Self-pay | Admitting: Internal Medicine

## 2011-09-10 ENCOUNTER — Ambulatory Visit (INDEPENDENT_AMBULATORY_CARE_PROVIDER_SITE_OTHER): Payer: BC Managed Care – PPO | Admitting: Internal Medicine

## 2011-09-10 VITALS — BP 140/70 | HR 98 | Temp 98.2°F | Wt 168.0 lb

## 2011-09-10 DIAGNOSIS — I10 Essential (primary) hypertension: Secondary | ICD-10-CM

## 2011-09-10 DIAGNOSIS — J069 Acute upper respiratory infection, unspecified: Secondary | ICD-10-CM | POA: Insufficient documentation

## 2011-09-10 DIAGNOSIS — H6500 Acute serous otitis media, unspecified ear: Secondary | ICD-10-CM | POA: Insufficient documentation

## 2011-09-10 MED ORDER — AZITHROMYCIN 250 MG PO TABS: 250.0000 mg | ORAL_TABLET | ORAL | Status: AC

## 2011-09-10 NOTE — Progress Notes (Signed)
  Subjective:    Patient ID: Shannon [name], female    DOB: [birth year], 60 y.o.   MRN: [id number]  HPI Patient comes in today for SDA  For acute problem evaluation. For 1 day.  No fever.  But achy HA congestion and som e st no rash no itching very congested and and frontal headache.  Unsure what to use for this . Feels bad and comes home from work . Has bilateral ear pain .  Allergies hx .  No real asthma NO tobacco.  ets husband.  No exposures. Marland Kitchen  tch with pcn and hives.  Review of Systems NO fever NVD new rash uti sx and bl has been ok.  No cp sob.  Past history family history social history reviewed in the electronic medical record.     Objective:   Physical Exam WDWN in NAD  quiet respirations; mildly congested   Non toxic . HEENT: Normocephalic ;atraumatic , Eyes;  PERRL, EOMs  Full, lids and conjunctiva clear,,Ears: no deformities, canals nl, TM landmarks normal on right left sliglthy pink and full    1+ wax in eac , Nose: no deformity or discharge but congested;face  Frontal minimally tender Mouth : OP clear without lesion or edema . Mild erythema Neck: Supple without adenopathy or masses or bruits Chest:  Clear to A&P without wheezes rales or rhonchi CV:  S1-S2 no gallops or murmurs peripheral perfusion is normal Skin :nl perfusion and no acute rashes   Neuro grossly non focal      Assessment & Plan:  Acute uri with frontal HA and left ? Serous otitis  prob viral with underlying allergy    Expectant management. And prob self limiting .  However if worsening pain over the weekend despite conservative rx can add antibiotic for ear infection. ( allergic to pcn   z pack has high rate of resistant germs but reasonable choice in her case,)  To fu if  persistent or progressive in reasonable amount of time.

## 2011-09-10 NOTE — Patient Instructions (Addendum)
This seems like a viral respiratory infection that may take a week or so to get better however the worse is the first 3 days.   Take a decongestant  To open  up the  sinuses .   But watch the blood pressure. Can also use afrin nose spray for 3 days maximum for nose congestion. Call if fever  Persistent or severe pain.   If the ear pain not getting better in 2 days or so can add antibiotic but wont help the rest of the infection .Needs to run its course. Most ear infections get better without antibiotics

## 2011-10-23 ENCOUNTER — Encounter: Payer: Self-pay | Admitting: Internal Medicine

## 2011-10-23 ENCOUNTER — Ambulatory Visit (INDEPENDENT_AMBULATORY_CARE_PROVIDER_SITE_OTHER): Payer: BC Managed Care – PPO | Admitting: Internal Medicine

## 2011-10-23 DIAGNOSIS — F4389 Other reactions to severe stress: Secondary | ICD-10-CM

## 2011-10-23 DIAGNOSIS — K219 Gastro-esophageal reflux disease without esophagitis: Secondary | ICD-10-CM

## 2011-10-23 DIAGNOSIS — F438 Other reactions to severe stress: Secondary | ICD-10-CM

## 2011-10-23 DIAGNOSIS — I10 Essential (primary) hypertension: Secondary | ICD-10-CM

## 2011-10-23 MED ORDER — OMEPRAZOLE 40 MG PO CPDR: 40.0000 mg | DELAYED_RELEASE_CAPSULE | Freq: Every day | ORAL | Status: DC

## 2011-10-23 MED ORDER — AMLODIPINE BESYLATE 2.5 MG PO TABS: 2.5000 mg | ORAL_TABLET | Freq: Every day | ORAL | Status: DC

## 2011-10-23 MED ORDER — SERTRALINE HCL 25 MG PO TABS: 25.0000 mg | ORAL_TABLET | Freq: Every day | ORAL | Status: DC

## 2011-10-23 NOTE — Assessment & Plan Note (Signed)
Patient experiencing stress reaction triggered by husband's poor health. Trial of sertraline 25 mg.

## 2011-10-23 NOTE — Progress Notes (Signed)
  Subjective:    Patient ID: Shannon [name], female    DOB: May 31, 1951, 60 y.o.   MRN: [id number]  HPI  60 year-old [place] American female with history of hypertension and GERD for routine followup. Patient reports recent exacerbation. She has been having more heartburn symptoms than usual. This is related to her husband's health problems. He has history of stroke and has been having increasing lapses of memory. He was reluctantly taken to the emergency room recently for evaluation.  He stopped taking his medications and is more prong to fits of anger.  She is at her "wits end".   Review of Systems Negative for dysphagia  Past Medical History  Diagnosis Date  . Allergy   . GERD (gastroesophageal reflux disease)   . Hypertension   . Headache     History   Social History  . Marital Status: Married    Spouse Name: N/A    Number of Children: N/A  . Years of Education: N/A   Occupational History  . Not on file.   Social History Main Topics  . Smoking status: Never Smoker   . Smokeless tobacco: Not on file  . Alcohol Use: No  . Drug Use: Not on file  . Sexually Active: Not on file   Other Topics Concern  . 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 Breast lumpectomy   . Tubal ligation     Family History  Problem Relation Age of Onset  . Hypertension Mother     age 24  . Alzheimer's disease Father     deceased secondary to alzheimer's disease    Allergies  Allergen Reactions  . Ace Inhibitors   . Penicillins     Current Outpatient Prescriptions on File Prior to Visit  Medication Sig Dispense Refill  . clobetasol (TEMOVATE) 0.05 % cream Apply topically 2 (two) times daily.  30 g  0  . triamcinolone (KENALOG) 0.5 % cream APPLY TO AFFECTED AREA 2 TIMES A DAY IF NEEDED.  15 g  1  .  valsartan (DIOVAN) 320 MG tablet Take 1 tablet (320 mg total) by mouth daily.  30 tablet  3    BP 134/84  Temp(Src) 98.4 F (36.9 C) (Oral)  Wt 165 lb (74.844 kg)      Objective:   Physical Exam   Constitutional: Appears well-developed and well-nourished. No distress.  Head: Normocephalic and atraumatic.  Cardiovascular: Normal rate, regular rhythm and normal heart sounds.  Exam reveals no gallop and no friction rub.  No murmur heard. Pulmonary/Chest: Effort normal and breath sounds normal.  No wheezes. No rales.  Abdominal: Soft. Bowel sounds are normal. No mass. There is no tenderness.  Neurological: Alert. No cranial nerve deficit.  Skin: Skin is warm and dry.  Psychiatric: tearful       Assessment & Plan:

## 2011-10-23 NOTE — Assessment & Plan Note (Addendum)
Suboptimally controlled. Add low-dose amlodipine 2.5 mg to Diovan 320 mg. BP with manual cuff 154/80

## 2011-10-23 NOTE — Assessment & Plan Note (Signed)
Exacerbation of gastroesophageal reflux disease secondary to life stressors. Refilled omeprazole 40 mg once daily.

## 2011-11-23 ENCOUNTER — Other Ambulatory Visit: Payer: Self-pay | Admitting: Internal Medicine

## 2011-11-23 NOTE — Telephone Encounter (Signed)
Refill sent in electronically 

## 2011-12-30 ENCOUNTER — Other Ambulatory Visit: Payer: Self-pay | Admitting: Dermatology

## 2012-02-04 ENCOUNTER — Other Ambulatory Visit: Payer: Self-pay | Admitting: Internal Medicine

## 2012-02-05 ENCOUNTER — Other Ambulatory Visit: Payer: Self-pay | Admitting: Gynecology

## 2012-02-10 ENCOUNTER — Telehealth: Payer: Self-pay | Admitting: Internal Medicine

## 2012-02-10 MED ORDER — TRIAMCINOLONE ACETONIDE 0.5 % EX CREA: TOPICAL_CREAM | Freq: Two times a day (BID) | CUTANEOUS | Status: DC

## 2012-02-10 NOTE — Telephone Encounter (Signed)
rx sent in electronically 

## 2012-02-10 NOTE — Telephone Encounter (Signed)
Patient called stating that she need a refill on her triamcinolone. Please assist.

## 2012-04-05 ENCOUNTER — Encounter: Payer: Self-pay | Admitting: Internal Medicine

## 2012-04-05 ENCOUNTER — Ambulatory Visit (INDEPENDENT_AMBULATORY_CARE_PROVIDER_SITE_OTHER): Payer: BC Managed Care – PPO | Admitting: Internal Medicine

## 2012-04-05 VITALS — BP 122/74 | HR 70 | Temp 98.1°F | Wt 164.0 lb

## 2012-04-05 DIAGNOSIS — J209 Acute bronchitis, unspecified: Secondary | ICD-10-CM

## 2012-04-05 DIAGNOSIS — R05 Cough: Secondary | ICD-10-CM | POA: Insufficient documentation

## 2012-04-05 MED ORDER — LEVOFLOXACIN 500 MG PO TABS: 500.0000 mg | ORAL_TABLET | Freq: Every day | ORAL | Status: AC

## 2012-04-05 MED ORDER — BECLOMETHASONE DIPROPIONATE 80 MCG/ACT IN AERS: 2.0000 | INHALATION_SPRAY | Freq: Two times a day (BID) | RESPIRATORY_TRACT | Status: DC

## 2012-04-05 NOTE — Progress Notes (Signed)
Subjective:    Patient ID: Shannon [name], female    DOB: 15-Oct-1951, 61 y.o.   MRN: [id number]  HPI  61 year old African American female complains of sore throat headache and wheezing x2 days. Her symptoms started on Friday with severe sore throat and progressed to cough, wheezing and headache. She is having some difficulty swallowing. She denies fever or but has experienced mild chills. She has tried over-the-counter cold preps without any improvement.   Review of Systems See HPI  Past Medical History  Diagnosis Date  . Allergy   . GERD (gastroesophageal reflux disease)   . Hypertension   . Headache     History   Social History  . Marital Status: Married    Spouse Name: N/A    Number of Children: N/A  . Years of Education: N/A   Occupational History  . Not on file.   Social History Main Topics  . Smoking status: Never Smoker   . Smokeless tobacco: Not on file  . Alcohol Use: No  . Drug Use: Not on file  . Sexually Active: Not on file   Other Topics Concern  . 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 Breast lumpectomy   . Tubal ligation     Family History  Problem Relation Age of Onset  . Hypertension Mother     age 63  . Alzheimer's disease Father     deceased secondary to alzheimer's disease    Allergies  Allergen Reactions  . Ace Inhibitors   . Penicillins     Current Outpatient Prescriptions on File Prior to Visit  Medication Sig Dispense Refill  . amLODipine (NORVASC) 2.5 MG tablet take 1 tablet by mouth once daily  30 tablet  2  . omeprazole (PRILOSEC) 40 MG capsule Take 1 capsule (40 mg total) by mouth daily.  90 capsule  1  . sertraline (ZOLOFT) 25 MG tablet Take 1 tablet (25 mg total) by mouth daily.  30 tablet  2  . triamcinolone cream (KENALOG) 0.5 % Apply  topically 2 (two) times daily.  15 g  3  . valACYclovir (VALTREX) 1000 MG tablet take 1 tablet by mouth twice a day  20 tablet  1  . valsartan (DIOVAN) 320 MG tablet Take 1 tablet (320 mg total) by mouth daily.  30 tablet  3  . beclomethasone (QVAR) 80 MCG/ACT inhaler Inhale 2 puffs into the lungs 2 (two) times daily.  1 Inhaler  0    BP 122/74  Pulse 70  Temp(Src) 98.1 F (36.7 C) (Oral)  Wt 164 lb (74.39 kg)  SpO2 98%       Objective:   Physical Exam  Constitutional: She is oriented to person, place, and time. She appears well-developed and well-nourished.  HENT:  Head: Normocephalic.  Right Ear: External ear normal.  Left Ear: External ear normal.  Mouth/Throat: No oropharyngeal exudate.       Oropharyngeal erythema  Neck: Neck supple.  Cardiovascular: Normal rate, regular rhythm and normal heart sounds.   Pulmonary/Chest: Effort normal. She has wheezes.       Coarse breath sounds left greater than right  Musculoskeletal: She exhibits no edema.  Lymphadenopathy:    She has no cervical adenopathy.  Neurological: She is alert and oriented  to person, place, and time.  Skin: Skin is warm and dry.  Psychiatric: She has a normal mood and affect. Her behavior is normal.       Assessment & Plan:

## 2012-04-05 NOTE — Assessment & Plan Note (Signed)
61 year old [place] American female with signs and symptoms of acute bronchitis. Treat with Levaquin 500 mg once daily x10 days. Sample of Qvar provided.  Patient advised to call office if symptoms persist or worsen.

## 2012-04-18 ENCOUNTER — Encounter: Payer: Self-pay | Admitting: Internal Medicine

## 2012-04-18 ENCOUNTER — Ambulatory Visit (INDEPENDENT_AMBULATORY_CARE_PROVIDER_SITE_OTHER): Payer: BC Managed Care – PPO | Admitting: Internal Medicine

## 2012-04-18 VITALS — BP 126/76 | Temp 98.2°F | Wt 164.0 lb

## 2012-04-18 DIAGNOSIS — G4700 Insomnia, unspecified: Secondary | ICD-10-CM

## 2012-04-18 DIAGNOSIS — I10 Essential (primary) hypertension: Secondary | ICD-10-CM

## 2012-04-18 DIAGNOSIS — R05 Cough: Secondary | ICD-10-CM

## 2012-04-18 MED ORDER — ZOLPIDEM TARTRATE 5 MG PO TABS: 5.0000 mg | ORAL_TABLET | Freq: Every evening | ORAL | Status: DC | PRN

## 2012-04-18 MED ORDER — AMLODIPINE BESYLATE 2.5 MG PO TABS: 2.5000 mg | ORAL_TABLET | Freq: Every day | ORAL | Status: DC

## 2012-04-18 MED ORDER — VALSARTAN 320 MG PO TABS: 320.0000 mg | ORAL_TABLET | Freq: Every day | ORAL | Status: DC

## 2012-04-18 MED ORDER — SERTRALINE HCL 25 MG PO TABS: 25.0000 mg | ORAL_TABLET | Freq: Every day | ORAL | Status: DC

## 2012-04-18 NOTE — Assessment & Plan Note (Signed)
61 year old [place] American female previously seen for acute bronchitis. She is significantly improved but is still experiencing intermittent productive cough. She is not febrile. Lungs are clear on exam.  Obtain chest x-ray to rule out pneumonia.

## 2012-04-18 NOTE — Patient Instructions (Signed)
Our office will call you with chest x ray results.  Please call our office if your cough dose not improve.

## 2012-04-18 NOTE — Progress Notes (Signed)
Subjective:    Patient ID: Shannon [name], female    DOB: Mar 31, 1951, 61 y.o.   MRN: [id number]  HPI  61 year old [place] American female for followup regarding acute bronchitis. Patient finished full course of Levaquin 500 mg x10 days. She also used Qvar as directed. In general her symptoms are greatly improved however patient has persistent intermittent cough.  Cough can be productive of discolored sputum.  She also complains of insomnia.  She is back working third shift.  Review of Systems Negative for fever or chills, negative for wheezing  Past Medical History  Diagnosis Date  . Allergy   . GERD (gastroesophageal reflux disease)   . Hypertension   . Headache     History   Social History  . Marital Status: Married    Spouse Name: N/A    Number of Children: N/A  . Years of Education: N/A   Occupational History  . Not on file.   Social History Main Topics  . Smoking status: Never Smoker   . Smokeless tobacco: Not on file  . Alcohol Use: No  . Drug Use: Not on file  . Sexually Active: Not on file   Other Topics Concern  . 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 Breast lumpectomy   . Tubal ligation     Family History  Problem Relation Age of Onset  . Hypertension Mother     age 26  . Alzheimer's disease Father     deceased secondary to alzheimer's disease    Allergies  Allergen Reactions  . Ace Inhibitors   . Penicillins     Current Outpatient Prescriptions on File Prior to Visit  Medication Sig Dispense Refill  . beclomethasone (QVAR) 80 MCG/ACT inhaler Inhale 2 puffs into the lungs 2 (two) times daily.  1 Inhaler  0  . omeprazole (PRILOSEC) 40 MG capsule Take 1 capsule (40 mg total) by mouth daily.  90 capsule  1  . triamcinolone cream (KENALOG) 0.5 % Apply topically 2  (two) times daily.  15 g  3  . valACYclovir (VALTREX) 1000 MG tablet take 1 tablet by mouth twice a day  20 tablet  1  . DISCONTD: amLODipine (NORVASC) 2.5 MG tablet take 1 tablet by mouth once daily  30 tablet  2  . DISCONTD: sertraline (ZOLOFT) 25 MG tablet Take 1 tablet (25 mg total) by mouth daily.  30 tablet  2  . DISCONTD: valsartan (DIOVAN) 320 MG tablet Take 1 tablet (320 mg total) by mouth daily.  30 tablet  3  . zolpidem (AMBIEN) 5 MG tablet Take 1 tablet (5 mg total) by mouth at bedtime as needed for sleep.  30 tablet  5    BP 126/76  Temp(Src) 98.2 F (36.8 C) (Oral)  Wt 164 lb (74.39 kg)       Objective:   Physical Exam  Constitutional: She is oriented to person, place, and time. She appears well-developed and well-nourished.  HENT:  Right Ear: External ear normal.  Left Ear: External ear normal.  Mouth/Throat: Oropharynx is clear and moist.  Cardiovascular: Normal rate, regular rhythm and normal heart sounds.   Pulmonary/Chest: Effort normal and breath sounds normal. She has no wheezes.  Neurological: She is alert and oriented to person, place,  and time.       Assessment & Plan:

## 2012-04-18 NOTE — Assessment & Plan Note (Signed)
Well controlled.  No change in medication.

## 2012-04-18 NOTE — Assessment & Plan Note (Signed)
Insomnia associated with shift work.  Use zolpidem as needed.

## 2012-04-22 ENCOUNTER — Telehealth: Payer: Self-pay | Admitting: Family Medicine

## 2012-04-22 NOTE — Telephone Encounter (Signed)
We received notification that this pt did not get her chest xray done.

## 2012-04-22 NOTE — Telephone Encounter (Signed)
plz call pt and see why she hasn't completed CXR

## 2012-04-25 NOTE — Telephone Encounter (Signed)
Pt is going to go today or tomorrow

## 2012-06-13 ENCOUNTER — Other Ambulatory Visit: Payer: Self-pay | Admitting: Obstetrics and Gynecology

## 2012-06-13 DIAGNOSIS — Z1231 Encounter for screening mammogram for malignant neoplasm of breast: Secondary | ICD-10-CM

## 2012-06-16 ENCOUNTER — Ambulatory Visit (INDEPENDENT_AMBULATORY_CARE_PROVIDER_SITE_OTHER): Payer: BC Managed Care – PPO | Admitting: Family

## 2012-06-16 ENCOUNTER — Encounter: Payer: Self-pay | Admitting: Family

## 2012-06-16 VITALS — BP 182/100 | HR 105 | Temp 98.6°F | Wt 163.0 lb

## 2012-06-16 DIAGNOSIS — I10 Essential (primary) hypertension: Secondary | ICD-10-CM

## 2012-06-16 DIAGNOSIS — G44209 Tension-type headache, unspecified, not intractable: Secondary | ICD-10-CM

## 2012-06-16 MED ORDER — KETOROLAC TROMETHAMINE 60 MG/2ML IM SOLN
60.0000 mg | Freq: Once | INTRAMUSCULAR | Status: AC
  Administered 2012-06-16: 60 mg via INTRAMUSCULAR

## 2012-06-16 MED ORDER — TRAMADOL HCL 50 MG PO TABS: 50.0000 mg | ORAL_TABLET | Freq: Three times a day (TID) | ORAL | Status: AC | PRN

## 2012-06-16 MED ORDER — CLONIDINE HCL 0.1 MG PO TABS: 0.1000 mg | ORAL_TABLET | Freq: Once | ORAL | Status: DC

## 2012-06-16 NOTE — Patient Instructions (Signed)

## 2012-06-16 NOTE — Progress Notes (Signed)
Subjective:    Patient ID: Shannon [name], female    DOB: Sep 26, 1951, 61 y.o.   MRN: [id number]  HPI 61 year old [place] American female, nonsmoker, patient of Dr. Artist Pais is in today with complaints of a headache x2 days. She describes the headache as a tightness across her for hay it, rates it 8/10. She's been taken ibuprofen and Aleve with no relief. She denies any sensitivity to light or noise, no nausea or vomiting. She has a history of hypertension and currently takes Diovan 320 mg daily Norvasc 25 mg daily. Her blood pressure in the past as been well controlled. Last blood pressure in June was 126/76. She consumes about 2 cups of caffeine a day. Does report an increased in amount of stress taking care of her elderly mother, who is 60 years old.   Review of Systems  Constitutional: Negative.   HENT: Negative for congestion, sore throat and rhinorrhea.   Eyes: Negative.   Respiratory: Negative.   Cardiovascular: Negative.   Gastrointestinal: Negative.   Musculoskeletal: Negative.   Skin: Negative.   Neurological: Positive for headaches.  Hematological: Negative.   Psychiatric/Behavioral: Negative.    Past Medical History  Diagnosis Date  . Allergy   . GERD (gastroesophageal reflux disease)   . Hypertension   . Headache     History   Social History  . Marital Status: Married    Spouse Name: N/A    Number of Children: N/A  . Years of Education: N/A   Occupational History  . Not on file.   Social History Main Topics  . Smoking status: Never Smoker   . Smokeless tobacco: Not on file  . Alcohol Use: No  . Drug Use: Not on file  . Sexually Active: Not on file   Other Topics Concern  . 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 Breast lumpectomy   . Tubal ligation     Family History    Problem Relation Age of Onset  . Hypertension Mother     age 34  . Alzheimer's disease Father     deceased secondary to alzheimer's disease    Allergies  Allergen Reactions  . Ace Inhibitors   . Penicillins     Current Outpatient Prescriptions on File Prior to Visit  Medication Sig Dispense Refill  . amLODipine (NORVASC) 2.5 MG tablet Take 1 tablet (2.5 mg total) by mouth daily.  90 tablet  1  . valsartan (DIOVAN) 320 MG tablet Take 1 tablet (320 mg total) by mouth daily.  90 tablet  1  . beclomethasone (QVAR) 80 MCG/ACT inhaler Inhale 2 puffs into the lungs 2 (two) times daily.  1 Inhaler  0  . omeprazole (PRILOSEC) 40 MG capsule Take 1 capsule (40 mg total) by mouth daily.  90 capsule  1  . sertraline (ZOLOFT) 25 MG tablet Take 1 tablet (25 mg total) by mouth daily.  90 tablet  1  . triamcinolone cream (KENALOG) 0.5 % Apply topically 2 (two) times daily.  15 g  3  . valACYclovir (VALTREX) 1000 MG tablet take 1 tablet by mouth twice a day  20 tablet  1  . zolpidem (AMBIEN) 5 MG tablet Take 1 tablet (5 mg total) by mouth at bedtime as needed for sleep.  30 tablet  5   No current facility-administered medications on file prior to visit.    BP 182/100  Pulse 105  Temp 98.6 F (37 C) (Oral)  Wt 163 lb (73.936 kg)  SpO2 96%chart    Objective:   Physical Exam  Constitutional: She is oriented to person, place, and time. She appears well-developed and well-nourished.  HENT:  Head: Normocephalic.  Right Ear: External ear normal.  Left Ear: External ear normal.  Nose: Nose normal.  Mouth/Throat: Oropharynx is clear and moist.  Eyes: Conjunctivae are normal. Pupils are equal, round, and reactive to light.  Neck: Normal range of motion. Neck supple. No thyromegaly present.  Cardiovascular: Normal rate, regular rhythm and normal heart sounds.   Pulmonary/Chest: Effort normal and breath sounds normal.  Abdominal: Soft. Bowel sounds are normal.  Musculoskeletal: Normal range of  motion.  Neurological: She is alert and oriented to person, place, and time. She has normal reflexes. She displays normal reflexes. No cranial nerve deficit. She exhibits normal muscle tone. Coordination normal.  Skin: Skin is warm and dry.  Psychiatric: She has a normal mood and affect.      Clonidine 0.1 mg by mouth x1 administered at 10:55 AM. Recheck Blood pressure 11:30- 150/80  Toradol 60 mg IM x1.    Assessment & Plan:  Assessment: Hypertension-uncontrolled, acute stress reaction, headache  Plan:Recheck in 1 week to be sure patient is not having a true hypertension issue of concern. Exercise to decrease stress levels and boost serotinin. Tramadol prn for headache. Patient to call the office if symptoms worsen or persist.

## 2012-06-24 ENCOUNTER — Ambulatory Visit (INDEPENDENT_AMBULATORY_CARE_PROVIDER_SITE_OTHER): Payer: BC Managed Care – PPO | Admitting: Family

## 2012-06-24 ENCOUNTER — Encounter: Payer: Self-pay | Admitting: Family

## 2012-06-24 VITALS — BP 170/90 | HR 83 | Temp 98.8°F | Wt 157.0 lb

## 2012-06-24 DIAGNOSIS — I10 Essential (primary) hypertension: Secondary | ICD-10-CM

## 2012-06-24 DIAGNOSIS — R51 Headache: Secondary | ICD-10-CM

## 2012-06-24 MED ORDER — METOPROLOL SUCCINATE ER 50 MG PO TB24: 50.0000 mg | ORAL_TABLET | Freq: Every day | ORAL | Status: DC

## 2012-06-24 NOTE — Progress Notes (Signed)
Subjective:    Patient ID: Shannon [name], female    DOB: [birth year], 61 y.o.   MRN: [id number]  HPI 61 year old [place] American female, nonsmoker is in for recheck of headaches and elevated blood pressure. She was seen one week ago for tension headaches they continue to occur. She has a headache about every other day lasting about an hour has been taking tramadol with no relief. She currently takes Diovan 320 mg and Norvasc 2.5 mg. She's tolerating the medications fine. Continues to be under a lot of stress taking care of her mother.   Review of Systems  Constitutional: Negative.   Respiratory: Negative.   Cardiovascular: Negative.   Gastrointestinal: Negative.   Musculoskeletal: Negative.   Skin: Negative.   Neurological: Positive for headaches. Negative for syncope and light-headedness.  Hematological: Negative.   Psychiatric/Behavioral: Negative.    Past Medical History  Diagnosis Date  . Allergy   . GERD (gastroesophageal reflux disease)   . Hypertension   . Headache     History   Social History  . Marital Status: Married    Spouse Name: N/A    Number of Children: N/A  . Years of Education: N/A   Occupational History  . Not on file.   Social History Main Topics  . Smoking status: Never Smoker   . Smokeless tobacco: Not on file  . Alcohol Use: No  . Drug Use: Not on file  . Sexually Active: Not on file   Other Topics Concern  . 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 Breast lumpectomy   . Tubal ligation     Family History  Problem Relation Age of Onset  . Hypertension Mother     age 21  . Alzheimer's disease Father     deceased secondary to alzheimer's disease    Allergies  Allergen Reactions  . Ace Inhibitors   . Penicillins     Current Outpatient Prescriptions  on File Prior to Visit  Medication Sig Dispense Refill  . beclomethasone (QVAR) 80 MCG/ACT inhaler Inhale 2 puffs into the lungs 2 (two) times daily.  1 Inhaler  0  . omeprazole (PRILOSEC) 40 MG capsule Take 1 capsule (40 mg total) by mouth daily.  90 capsule  1  . sertraline (ZOLOFT) 25 MG tablet Take 1 tablet (25 mg total) by mouth daily.  90 tablet  1  . traMADol (ULTRAM) 50 MG tablet Take 1-2 tablets (50-100 mg total) by mouth every 8 (eight) hours as needed for pain.  60 tablet  0  . triamcinolone cream (KENALOG) 0.5 % Apply topically 2 (two) times daily.  15 g  3  . valACYclovir (VALTREX) 1000 MG tablet take 1 tablet by mouth twice a day  20 tablet  1  . valsartan (DIOVAN) 320 MG tablet Take 1 tablet (320 mg total) by mouth daily.  90 tablet  1  . zolpidem (AMBIEN) 5 MG tablet Take 1 tablet (5 mg total) by mouth at bedtime as needed for sleep.  30 tablet  5  . metoprolol succinate (TOPROL-XL) 50 MG 24 hr tablet Take 1 tablet (50 mg total) by mouth daily. Take with or immediately following a meal.  30 tablet  3   Current Facility-Administered Medications on File Prior to Visit  Medication Dose Route Frequency Provider Last Rate Last Dose  . cloNIDine (CATAPRES) tablet 0.1 mg  0.1 mg Oral Once Baker Pierini, FNP        BP 170/90  Pulse 83  Temp 98.8 F (37.1 C) (Oral)  Wt 157 lb (71.215 kg)  SpO2 95%chart   Objective:   Physical Exam  Constitutional: She is oriented to person, place, and time. She appears well-developed and well-nourished.  Neck: Normal range of motion. Neck supple.  Cardiovascular: Normal rate, regular rhythm and normal heart sounds.   Pulmonary/Chest: Effort normal and breath sounds normal.  Neurological: She is alert and oriented to person, place, and time.  Skin: Skin is warm and dry.  Psychiatric: She has a normal mood and affect.          Assessment & Plan:  Assessment: Hypertension, tension headache  Plan: DC Norvasc. Metoprolol 50 mg extended  release once daily. Continue Diovan. Hopefully the metoprolol will help with her headaches as well as her blood pressure. She will call with blood pressure readings in about 10 days. Encouraged healthy diet, exercise, weight reduction. We'll follow the patient in 3 weeks and sooner when necessary. In and in and and in and

## 2012-06-30 ENCOUNTER — Ambulatory Visit (HOSPITAL_COMMUNITY): Payer: BC Managed Care – PPO | Attending: Obstetrics and Gynecology

## 2012-07-15 ENCOUNTER — Ambulatory Visit (INDEPENDENT_AMBULATORY_CARE_PROVIDER_SITE_OTHER): Payer: BC Managed Care – PPO | Admitting: Internal Medicine

## 2012-07-15 ENCOUNTER — Encounter: Payer: Self-pay | Admitting: Internal Medicine

## 2012-07-15 VITALS — BP 180/110 | HR 72 | Temp 98.0°F | Wt 164.0 lb

## 2012-07-15 DIAGNOSIS — R519 Headache, unspecified: Secondary | ICD-10-CM | POA: Insufficient documentation

## 2012-07-15 DIAGNOSIS — R51 Headache: Secondary | ICD-10-CM

## 2012-07-15 DIAGNOSIS — I10 Essential (primary) hypertension: Secondary | ICD-10-CM

## 2012-07-15 MED ORDER — VALSARTAN-HYDROCHLOROTHIAZIDE 320-12.5 MG PO TABS: 1.0000 | ORAL_TABLET | Freq: Every day | ORAL | Status: DC

## 2012-07-15 MED ORDER — CYCLOBENZAPRINE HCL 5 MG PO TABS: ORAL_TABLET | ORAL | Status: DC

## 2012-07-15 MED ORDER — LABETALOL HCL 200 MG PO TABS: 200.0000 mg | ORAL_TABLET | Freq: Two times a day (BID) | ORAL | Status: DC

## 2012-07-15 NOTE — Patient Instructions (Addendum)
Please complete the following lab tests before your next follow up appointment: BMET - 401.9 Increase your intake of high potassium foods Follow low salt (2.5 grams /day) diet

## 2012-07-15 NOTE — Assessment & Plan Note (Signed)
Blood pressure is suboptimally controlled. Discontinue metoprolol. Switch to labetalol 200 mg twice daily. Add HCTZ 12.5 mg to Diovan 320 mg once daily. Reassess in one month.  BP: 180/110 mmHg

## 2012-07-15 NOTE — Assessment & Plan Note (Signed)
I suspect patient having tension headaches. Trial of muscle relaxer-cyclobenzaprine 5 mg at bedtime.

## 2012-07-15 NOTE — Progress Notes (Signed)
Subjective:    Patient ID: Shannon [name], female    DOB: Sep 10, 1951, 61 y.o.   MRN: [id number]  HPI  61 year old African American female for followup regarding hypertension and headache. She was seen last month by nurse practitioner and blood pressure medication regimen was changed. Amlodipine was discontinued and beta blocker metoprolol was added.  Patient reports since changing medication her blood pressure is higher. She also having persistent headache. She denies any associated photophobia sonophobia, or nausea.  Review of Systems She has frontal headache  Past Medical History  Diagnosis Date  . Allergy   . GERD (gastroesophageal reflux disease)   . Hypertension   . Headache     History   Social History  . Marital Status: Married    Spouse Name: N/A    Number of Children: N/A  . Years of Education: N/A   Occupational History  . Not on file.   Social History Main Topics  . Smoking status: Never Smoker   . Smokeless tobacco: Not on file  . Alcohol Use: No  . Drug Use: Not on file  . Sexually Active: Not on file   Other Topics Concern  . 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 Breast lumpectomy   . Tubal ligation     Family History  Problem Relation Age of Onset  . Hypertension Mother     age 55  . Alzheimer's disease Father     deceased secondary to alzheimer's disease    Allergies  Allergen Reactions  . Ace Inhibitors   . Penicillins     Current Outpatient Prescriptions on File Prior to Visit  Medication Sig Dispense Refill  . beclomethasone (QVAR) 80 MCG/ACT inhaler Inhale 2 puffs into the lungs 2 (two) times daily.  1 Inhaler  0  . omeprazole (PRILOSEC) 40 MG capsule Take 1 capsule (40 mg total) by mouth daily.  90 capsule  1  . sertraline (ZOLOFT) 25 MG tablet  Take 1 tablet (25 mg total) by mouth daily.  90 tablet  1  . triamcinolone cream (KENALOG) 0.5 % Apply topically 2 (two) times daily.  15 g  3  . valACYclovir (VALTREX) 1000 MG tablet take 1 tablet by mouth twice a day  20 tablet  1  . zolpidem (AMBIEN) 5 MG tablet Take 1 tablet (5 mg total) by mouth at bedtime as needed for sleep.  30 tablet  5  . labetalol (NORMODYNE) 200 MG tablet Take 1 tablet (200 mg total) by mouth 2 (two) times daily.  60 tablet  2  . valsartan-hydrochlorothiazide (DIOVAN-HCT) 320-12.5 MG per tablet Take 1 tablet by mouth daily.  90 tablet  1   Current Facility-Administered Medications on File Prior to Visit  Medication Dose Route Frequency Provider Last Rate Last Dose  . cloNIDine (CATAPRES) tablet 0.1 mg  0.1 mg Oral Once Baker Pierini, FNP        BP 180/110  Pulse 72  Temp 98 F (36.7 C) (Oral)  Wt 164 lb (74.39 kg)       Objective:   Physical Exam  Constitutional: She is oriented to person, place, and time. She appears well-developed and well-nourished.  Cardiovascular: Normal rate, regular rhythm and normal heart sounds.   Pulmonary/Chest: Effort normal and  breath sounds normal. She has no wheezes.  Musculoskeletal: She exhibits no edema.  Neurological: She is alert and oriented to person, place, and time. No cranial nerve deficit.  Skin: Skin is warm and dry.          Assessment & Plan:

## 2012-07-25 ENCOUNTER — Ambulatory Visit (HOSPITAL_COMMUNITY)
Admission: RE | Admit: 2012-07-25 | Discharge: 2012-07-25 | Disposition: A | Discharge status: Home / Self Care | Payer: BC Managed Care – PPO | Source: Ambulatory Visit | Attending: Obstetrics and Gynecology | Admitting: Obstetrics and Gynecology

## 2012-07-25 DIAGNOSIS — Z1231 Encounter for screening mammogram for malignant neoplasm of breast: Secondary | ICD-10-CM | POA: Insufficient documentation

## 2012-07-29 ENCOUNTER — Ambulatory Visit (INDEPENDENT_AMBULATORY_CARE_PROVIDER_SITE_OTHER): Payer: BC Managed Care – PPO | Admitting: Internal Medicine

## 2012-07-29 ENCOUNTER — Encounter: Payer: Self-pay | Admitting: Internal Medicine

## 2012-07-29 VITALS — BP 124/76 | HR 76 | Temp 97.9°F | Wt 162.0 lb

## 2012-07-29 DIAGNOSIS — I10 Essential (primary) hypertension: Secondary | ICD-10-CM

## 2012-07-29 DIAGNOSIS — R51 Headache: Secondary | ICD-10-CM

## 2012-07-29 DIAGNOSIS — F430 Acute stress reaction: Secondary | ICD-10-CM

## 2012-07-29 LAB — BASIC METABOLIC PANEL
GFR: 98.4 mL/min (ref >60.00)
Potassium: 3.8 mEq/L (ref 3.5–5.1)
Sodium: 136 mEq/L (ref 135–145)

## 2012-07-29 MED ORDER — TRIAMCINOLONE ACETONIDE 0.5 % EX CREA: TOPICAL_CREAM | Freq: Two times a day (BID) | CUTANEOUS | Status: DC

## 2012-07-29 MED ORDER — SERTRALINE HCL 100 MG PO TABS: ORAL_TABLET | ORAL | Status: DC

## 2012-07-29 NOTE — Progress Notes (Signed)
Subjective:    Patient ID: Shannon [name], female    DOB: 1950-12-25, 61 y.o.   MRN: [id number]  HPI  61 year old African American female for follow up regarding headache and hypertension.Patient reports headache frequency and severity have improved since using muscle relaxer. Her headache symptoms are definitely stress related. She reports increased life stressors at home.  She works the evening shift and finds it difficult to get good quality sleep during the day due to numerous interruptions.  She is feeling "stressed out".  Her husband is not always supportive.  Hypertension-blood sugars have improved.   Review of Systems Negative for chest pain  Past Medical History  Diagnosis Date  . Allergy   . GERD (gastroesophageal reflux disease)   . Hypertension   . Headache     History   Social History  . Marital Status: Married    Spouse Name: N/A    Number of Children: N/A  . Years of Education: N/A   Occupational History  . Not on file.   Social History Main Topics  . Smoking status: Never Smoker   . Smokeless tobacco: Not on file  . Alcohol Use: No  . Drug Use: Not on file  . Sexually Active: Not on file   Other Topics Concern  . 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 Breast lumpectomy   . Tubal ligation     Family History  Problem Relation Age of Onset  . Hypertension Mother     age 13  . Alzheimer's disease Father     deceased secondary to alzheimer's disease    Allergies  Allergen Reactions  . Ace Inhibitors   . Penicillins     Current Outpatient Prescriptions on File Prior to Visit  Medication Sig Dispense Refill  . omeprazole (PRILOSEC) 40 MG capsule Take 1 capsule (40 mg total) by mouth daily.  90 capsule  1  . valACYclovir (VALTREX) 1000 MG tablet take 1 tablet by  mouth twice a day  20 tablet  1  . valsartan-hydrochlorothiazide (DIOVAN-HCT) 320-12.5 MG per tablet Take 1 tablet by mouth daily.  90 tablet  1  . zolpidem (AMBIEN) 5 MG tablet Take 1 tablet (5 mg total) by mouth at bedtime as needed for sleep.  30 tablet  5  . DISCONTD: sertraline (ZOLOFT) 25 MG tablet Take 1 tablet (25 mg total) by mouth daily.  90 tablet  1   Current Facility-Administered Medications on File Prior to Visit  Medication Dose Route Frequency Provider Last Rate Last Dose  . cloNIDine (CATAPRES) tablet 0.1 mg  0.1 mg Oral Once Baker Pierini, FNP        BP 124/76  Pulse 76  Temp 97.9 F (36.6 C) (Oral)  Wt 162 lb (73.483 kg)       Objective:   Physical Exam  Constitutional: She is oriented to person, place, and time. She appears well-developed and well-nourished. No distress.  Cardiovascular: Normal rate, regular rhythm, normal heart sounds and intact distal pulses.   Pulmonary/Chest: Effort normal and breath sounds normal. She has no wheezes.  Musculoskeletal: She exhibits no edema.  Neurological: She is alert and oriented to person, place, and time. No cranial nerve deficit.  Skin: Skin is warm and dry.  Assessment & Plan:

## 2012-07-29 NOTE — Assessment & Plan Note (Addendum)
Blood pressure has improved. Continue current medication regimen. Monitor electrolytes and kidney function. BP: 124/76 mmHg

## 2012-07-29 NOTE — Assessment & Plan Note (Signed)
Patient experiencing significant life stressors that are overwhelming. Increase sertraline dose to 50 mg for one month. Increased to 100 mg if insufficient response to 50 mg dose.

## 2012-07-29 NOTE — Assessment & Plan Note (Signed)
Headaches are improved. They are definitely stress related.  Increase sertraline dose to 50-100 mg daily.

## 2012-08-17 ENCOUNTER — Ambulatory Visit: Payer: BC Managed Care – PPO | Admitting: Internal Medicine

## 2012-09-07 ENCOUNTER — Other Ambulatory Visit: Payer: Self-pay | Admitting: Internal Medicine

## 2012-09-30 ENCOUNTER — Ambulatory Visit (INDEPENDENT_AMBULATORY_CARE_PROVIDER_SITE_OTHER): Payer: BC Managed Care – PPO | Admitting: Internal Medicine

## 2012-09-30 ENCOUNTER — Encounter: Payer: Self-pay | Admitting: Internal Medicine

## 2012-09-30 VITALS — BP 132/82 | HR 72 | Temp 98.0°F | Wt 166.0 lb

## 2012-09-30 DIAGNOSIS — R51 Headache: Secondary | ICD-10-CM

## 2012-09-30 DIAGNOSIS — I10 Essential (primary) hypertension: Secondary | ICD-10-CM

## 2012-09-30 MED ORDER — LABETALOL HCL 200 MG PO TABS: 200.0000 mg | ORAL_TABLET | Freq: Two times a day (BID) | ORAL | Status: DC

## 2012-09-30 NOTE — Assessment & Plan Note (Signed)
Resolved with muscle relaxer.  Also better with improved BP control.

## 2012-09-30 NOTE — Assessment & Plan Note (Signed)
Improved controlled with Diovan hydrochlorothiazide 320/2.5 mg and labetalol 200 mg twice daily.  BP: 132/82 mmHg  Monitor BMET before next OV in 6 months

## 2012-09-30 NOTE — Progress Notes (Signed)
Subjective:    Patient ID: Shannon [name], female    DOB: 09/12/1951, 61 y.o.   MRN: [id number]  HPI  61 year old African American female for follow up regarding hypertension and headache. Patient reports her blood pressure much better since starting new regimen. She is currently taking valsartan hydrochlorothiazide 320/12.5 mg and labetalol 200 mg twice daily. She denies any adverse effects. She's not having any lightheadedness or dizziness.  Her tension headaches have resolved.  Review of Systems Negative for chest pain or shortness of breath  Past Medical History  Diagnosis Date  . Allergy   . GERD (gastroesophageal reflux disease)   . Hypertension   . Headache     History   Social History  . Marital Status: Married    Spouse Name: N/A    Number of Children: N/A  . Years of Education: N/A   Occupational History  . Not on file.   Social History Main Topics  . Smoking status: Never Smoker   . Smokeless tobacco: Not on file  . Alcohol Use: No  . Drug Use: Not on file  . Sexually Active: Not on file   Other Topics Concern  . 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 Breast lumpectomy   . Tubal ligation     Family History  Problem Relation Age of Onset  . Hypertension Mother     age 39  . Alzheimer's disease Father     deceased secondary to alzheimer's disease    Allergies  Allergen Reactions  . Ace Inhibitors   . Penicillins     Current Outpatient Prescriptions on File Prior to Visit  Medication Sig Dispense Refill  . omeprazole (PRILOSEC) 40 MG capsule Take 1 capsule (40 mg total) by mouth daily.  90 capsule  1  . sertraline (ZOLOFT) 100 MG tablet Take 1/2 tablet for one month, then one tablet daily  90 tablet  1  . triamcinolone cream (KENALOG) 0.5 % Apply topically 2  (two) times daily.  15 g  5  . valACYclovir (VALTREX) 1000 MG tablet take 1 tablet by mouth twice a day  20 tablet  1  . valsartan-hydrochlorothiazide (DIOVAN-HCT) 320-12.5 MG per tablet Take 1 tablet by mouth daily.  90 tablet  1  . zolpidem (AMBIEN) 5 MG tablet Take 1 tablet (5 mg total) by mouth at bedtime as needed for sleep.  30 tablet  5   Current Facility-Administered Medications on File Prior to Visit  Medication Dose Route Frequency Provider Last Rate Last Dose  . cloNIDine (CATAPRES) tablet 0.1 mg  0.1 mg Oral Once Baker Pierini, FNP        BP 132/82  Pulse 72  Temp 98 F (36.7 C) (Oral)  Wt 166 lb (75.297 kg)       Objective:   Physical Exam  Constitutional: She is oriented to person, place, and time. She appears well-developed and well-nourished.  Cardiovascular: Normal rate, regular rhythm and normal heart sounds.   Pulmonary/Chest: Effort normal and breath sounds normal. She has no wheezes.  Neurological: She is alert and oriented to person, place, and time.  Psychiatric: She has a normal mood and affect. Her behavior is normal.          Assessment & Plan:

## 2012-09-30 NOTE — Patient Instructions (Addendum)
Please complete the following lab tests before your next follow up appointment: BMET, FLP - 401.9 

## 2012-10-18 ENCOUNTER — Other Ambulatory Visit: Payer: Self-pay | Admitting: Internal Medicine

## 2012-10-26 ENCOUNTER — Telehealth: Payer: Self-pay | Admitting: Internal Medicine

## 2012-10-26 MED ORDER — ZOLPIDEM TARTRATE 5 MG PO TABS: 5.0000 mg | ORAL_TABLET | Freq: Every evening | ORAL | Status: DC | PRN

## 2012-10-26 NOTE — Telephone Encounter (Signed)
Pt needs refills

## 2012-10-26 NOTE — Telephone Encounter (Signed)
rx called in

## 2012-10-26 NOTE — Telephone Encounter (Signed)
Patient called stating that she took her rx to [facility] [address] and they are waiting to hear from our office before they will approve it. Please call about her zolpidem (AMBIEN) 5 MG tablet .

## 2012-10-26 NOTE — Telephone Encounter (Signed)
Ok to refill x 5 

## 2012-12-27 ENCOUNTER — Other Ambulatory Visit: Payer: Self-pay | Admitting: Internal Medicine

## 2013-01-30 ENCOUNTER — Other Ambulatory Visit: Payer: Self-pay | Admitting: Internal Medicine

## 2013-01-30 MED ORDER — VALSARTAN-HYDROCHLOROTHIAZIDE 320-12.5 MG PO TABS: 1.0000 | ORAL_TABLET | Freq: Every day | ORAL | Status: DC

## 2013-06-15 ENCOUNTER — Other Ambulatory Visit: Payer: Self-pay | Admitting: Internal Medicine

## 2013-06-15 MED ORDER — ZOLPIDEM TARTRATE 5 MG PO TABS: 5.0000 mg | ORAL_TABLET | Freq: Every evening | ORAL | Status: DC | PRN

## 2013-06-15 MED ORDER — TRAMADOL HCL 50 MG PO TABS: 50.0000 mg | ORAL_TABLET | Freq: Every day | ORAL | Status: DC | PRN

## 2013-08-22 ENCOUNTER — Other Ambulatory Visit: Payer: Self-pay | Admitting: Internal Medicine

## 2013-08-24 ENCOUNTER — Other Ambulatory Visit: Payer: Self-pay | Admitting: Internal Medicine

## 2013-09-29 ENCOUNTER — Ambulatory Visit (INDEPENDENT_AMBULATORY_CARE_PROVIDER_SITE_OTHER): Payer: PRIVATE HEALTH INSURANCE | Admitting: Family Medicine

## 2013-09-29 ENCOUNTER — Encounter: Payer: Self-pay | Admitting: Family Medicine

## 2013-09-29 VITALS — BP 140/80 | Temp 97.9°F | Wt 175.0 lb

## 2013-09-29 DIAGNOSIS — R42 Dizziness and giddiness: Secondary | ICD-10-CM

## 2013-09-29 MED ORDER — MECLIZINE HCL 25 MG PO TABS: 25.0000 mg | ORAL_TABLET | Freq: Three times a day (TID) | ORAL | Status: DC | PRN

## 2013-09-29 NOTE — Patient Instructions (Signed)

## 2013-09-29 NOTE — Progress Notes (Addendum)
Chief Complaint  Patient presents with  . Dizziness    HPI:  Dizziness: -started all of a sudden last night when jump up out of bed suddenly -today noticing when turns head to the R too fast gets dizzy and a little nausea -denies: HA, fevers, hearing loss, SOB, CP, palpitations or other symptoms  ROS: See pertinent positives and negatives per HPI.  Past Medical History  Diagnosis Date  . Allergy   . GERD (gastroesophageal reflux disease)   . Hypertension   . AOZHYQMV(784.6)     Past Surgical History  Procedure Laterality Date  . Breast lumpectomy    . Tubal ligation      Family History  Problem Relation Age of Onset  . Hypertension Mother     age 109  . Alzheimer's disease Father     deceased secondary to alzheimer's disease    History   Social History  . Marital Status: Married    Spouse Name: N/A    Number of Children: N/A  . Years of Education: N/A   Social History Main Topics  . Smoking status: Never Smoker   . Smokeless tobacco: None  . Alcohol Use: No  . Drug Use: None  . Sexual Activity: None   Other Topics Concern  . 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[place])            Current outpatient prescriptions:labetalol (NORMODYNE) 200 MG tablet, take 1 tablet by mouth twice a day, Disp: 60 tablet, Rfl: 2;  traMADol (ULTRAM) 50 MG tablet, Take 1 tablet (50 mg total) by mouth daily as needed for pain., Disp: 30 tablet, Rfl: 1;  triamcinolone cream (KENALOG) 0.5 %, apply to affected area twice a day, Disp: 15 g, Rfl: 5;  valACYclovir (VALTREX) 1000 MG tablet, take 1 tablet by mouth twice a day, Disp: 20 tablet, Rfl: 1 valsartan-hydrochlorothiazide (DIOVAN-HCT) 320-12.5 MG per tablet, take 1 tablet by mouth once daily, Disp: 90 tablet, Rfl: 0;  zolpidem (AMBIEN) 5 MG tablet, Take 1 tablet (5 mg total) by mouth at bedtime as  needed for sleep., Disp: 30 tablet, Rfl: 5;  meclizine (ANTIVERT) 25 MG tablet, Take 1 tablet (25 mg total) by mouth 3 (three) times daily as needed for dizziness., Disp: 30 tablet, Rfl: 0 omeprazole (PRILOSEC) 40 MG capsule, Take 1 capsule (40 mg total) by mouth daily., Disp: 90 capsule, Rfl: 1;  sertraline (ZOLOFT) 100 MG tablet, Take 1/2 tablet for one month, then one tablet daily, Disp: 90 tablet, Rfl: 1 Current facility-administered medications:cloNIDine (CATAPRES) tablet 0.1 mg, 0.1 mg, Oral, Once, Baker Pierini, FNP  EXAMCeasar Mons Vitals:   09/29/13 1346  BP: 140/80  Temp: 97.9 F (36.6 C)    Body mass index is 27.4 kg/(m^2).  GENERAL: vitals reviewed and listed above, alert, oriented, appears well hydrated and in no acute distress  HEENT: atraumatic, conjunttiva clear, no obvious abnormalities on inspection of external nose and ears  NECK: no obvious masses on inspection, no carotid bruit  LUNGS: clear to auscultation bilaterally, no wheezes, rales or rhonchi, good air movement  CV: HRRR, no peripheral edema  MS: moves all extremities without noticeable abnormality  PSYCH: pleasant and cooperative, no obvious depression or anxiety  NEURO: dix hallpike positive on R, CN II-XII grossly in  tact, finger to nose normal  ASSESSMENT AND PLAN:  Discussed the following assessment and plan:  Vertigo - Plan: Ambulatory referral to Physical Therapy, meclizine (ANTIVERT) 25 MG tablet  -hx and exam c/w BPPV, discussed other less likely etiologies -meclizine after discussion risks, referral to PT for vestibular rehab, return precuations -advised not to drive until feeling better -Patient advised to return or notify a doctor immediately if symptoms worsen or persist or new concerns arise.  Patient Instructions  Benign Positional Vertigo Vertigo means you feel like you or your surroundings are moving when they are not. Benign positional vertigo is the most common form of  vertigo. Benign means that the cause of your condition is not serious. Benign positional vertigo is more common in older adults. CAUSES  Benign positional vertigo is the result of an upset in the labyrinth system. This is an area in the middle ear that helps control your balance. This may be caused by a viral infection, head injury, or repetitive motion. However, often no specific cause is found. SYMPTOMS  Symptoms of benign positional vertigo occur when you move your head or eyes in different directions. Some of the symptoms may include:  Loss of balance and falls.  Vomiting.  Blurred vision.  Dizziness.  Nausea.  Involuntary eye movements (nystagmus). DIAGNOSIS  Benign positional vertigo is usually diagnosed by physical exam. If the specific cause of your benign positional vertigo is unknown, your caregiver may perform imaging tests, such as magnetic resonance imaging (MRI) or computed tomography (CT). TREATMENT  Your caregiver may recommend movements or procedures to correct the benign positional vertigo. Medicines such as meclizine, benzodiazepines, and medicines for nausea may be used to treat your symptoms. In rare cases, if your symptoms are caused by certain conditions that affect the inner ear, you may need surgery. HOME CARE INSTRUCTIONS   Follow your caregiver's instructions.  Move slowly. Do not make sudden body or head movements.  Avoid driving.  Avoid operating heavy machinery.  Avoid performing any tasks that would be dangerous to you or others during a vertigo episode.  Drink enough fluids to keep your urine clear or pale yellow. SEEK IMMEDIATE MEDICAL CARE IF:   You develop problems with walking, weakness, numbness, or using your arms, hands, or legs.  You have difficulty speaking.  You develop severe headaches.  Your nausea or vomiting continues or gets worse.  You develop visual changes.  Your family or friends notice any behavioral changes.  Your  condition gets worse.  You have a fever.  You develop a stiff neck or sensitivity to light. MAKE SURE YOU:   Understand these instructions.  Will watch your condition.  Will get help right away if you are not doing well or get worse. Document Released: 08/03/2006 Document Revised: 01/18/2012 Document Reviewed: 07/16/2011 [facility] Patient Information 2014 [place], Lona Kettle, Dahlia Client R.

## 2013-10-10 ENCOUNTER — Other Ambulatory Visit: Payer: Self-pay | Admitting: Family Medicine

## 2013-10-10 NOTE — Telephone Encounter (Signed)
Has she done the physical therapy? The meclizine was just to use until had treatment as doe snot cure the condition just helps with symptoms. If still feeling bad despite therapy may need to be seen again?

## 2013-10-17 ENCOUNTER — Ambulatory Visit: Payer: PRIVATE HEALTH INSURANCE | Attending: Family Medicine | Admitting: Physical Therapy

## 2013-10-17 DIAGNOSIS — R269 Unspecified abnormalities of gait and mobility: Secondary | ICD-10-CM | POA: Insufficient documentation

## 2013-10-17 DIAGNOSIS — IMO0001 Reserved for inherently not codable concepts without codable children: Secondary | ICD-10-CM | POA: Insufficient documentation

## 2013-10-17 DIAGNOSIS — R42 Dizziness and giddiness: Secondary | ICD-10-CM | POA: Insufficient documentation

## 2013-11-26 ENCOUNTER — Other Ambulatory Visit: Payer: Self-pay | Admitting: Internal Medicine

## 2013-11-28 ENCOUNTER — Telehealth: Payer: Self-pay | Admitting: Internal Medicine

## 2013-11-28 NOTE — Telephone Encounter (Signed)
Pt scheduled for ov with PCP on 12/08/13, requesting refill for valsartan-hydrochlorothiazide (DIOVAN-HCT) 320-12.5 MG per tablet be approved as it was previously denied.

## 2013-11-29 MED ORDER — VALSARTAN-HYDROCHLOROTHIAZIDE 320-12.5 MG PO TABS: ORAL_TABLET | ORAL | Status: DC

## 2013-11-29 NOTE — Telephone Encounter (Signed)
rx sent in electronically 

## 2013-12-08 ENCOUNTER — Encounter: Payer: Self-pay | Admitting: *Deleted

## 2013-12-08 ENCOUNTER — Ambulatory Visit (INDEPENDENT_AMBULATORY_CARE_PROVIDER_SITE_OTHER): Payer: PRIVATE HEALTH INSURANCE | Admitting: Internal Medicine

## 2013-12-08 VITALS — BP 142/90 | HR 84 | Temp 97.9°F | Ht 67.0 in | Wt 172.0 lb

## 2013-12-08 DIAGNOSIS — I10 Essential (primary) hypertension: Secondary | ICD-10-CM

## 2013-12-08 DIAGNOSIS — F4389 Other reactions to severe stress: Secondary | ICD-10-CM

## 2013-12-08 DIAGNOSIS — F438 Other reactions to severe stress: Secondary | ICD-10-CM

## 2013-12-08 DIAGNOSIS — G4700 Insomnia, unspecified: Secondary | ICD-10-CM

## 2013-12-08 DIAGNOSIS — F430 Acute stress reaction: Secondary | ICD-10-CM

## 2013-12-08 MED ORDER — NEBIVOLOL HCL 10 MG PO TABS: 10.0000 mg | ORAL_TABLET | Freq: Every day | ORAL | Status: DC

## 2013-12-08 MED ORDER — SERTRALINE HCL 50 MG PO TABS: 50.0000 mg | ORAL_TABLET | Freq: Every day | ORAL | Status: DC

## 2013-12-08 MED ORDER — ZOLPIDEM TARTRATE 5 MG PO TABS: 5.0000 mg | ORAL_TABLET | Freq: Every evening | ORAL | Status: DC | PRN

## 2013-12-08 MED ORDER — VALSARTAN-HYDROCHLOROTHIAZIDE 320-12.5 MG PO TABS: ORAL_TABLET | ORAL | Status: DC

## 2013-12-08 NOTE — Assessment & Plan Note (Signed)
Restart sertraline 50 mg once daily

## 2013-12-08 NOTE — Assessment & Plan Note (Signed)
Patient's blood pressure is suboptimally controlled. Discontinue labetalol. Replace with Bystolic 10 mg once daily. Continue valsartan hydrochlorothiazide 320/12.5 mg once daily. Monitor electrolytes and kidney function. She could not tolerate amlodipine in the past due to nausea. BP: 142/90 mmHg

## 2013-12-08 NOTE — Progress Notes (Signed)
Subjective:    Patient ID: Shannon [name], female    DOB: 1950/11/29, 63 y.o.   MRN: [id number]  HPI  63 year old African American female with history of hypertension, chronic insomnia and adjustment disorder with anxiety/depression for followup. Patient reports changing jobs. She is making less income. She also reports chronic life stressors at home. Her husband is unemployed due to history of stroke.  She tapered off sertraline.  Husband experiencing new behavioral issues.  He does not shower on regular basis unless she urges him.  She is working second shift. She uses Ambien on as-needed basis.  Hypertension-she reports good medication compliance. Her blood pressure is suboptimally controlled.   Review of Systems Mild weight gain.  No chest pain    Past Medical History  Diagnosis Date  . Allergy   . GERD (gastroesophageal reflux disease)   . Hypertension   . Headache(784.0)     History   Social History  . Marital Status: Married    Spouse Name: N/A    Number of Children: N/A  . Years of Education: N/A   Occupational History  . Not on file.   Social History Main Topics  . Smoking status: Never Smoker   . Smokeless tobacco: Not on file  . Alcohol Use: No  . Drug Use: Yes    Special: Marijuana  . Sexual Activity: Not on file   Other Topics Concern  . 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 Breast lumpectomy    . Tubal ligation      Family History  Problem Relation Age of Onset  . Hypertension Mother     age 16  . Alzheimer's disease Father     deceased secondary to alzheimer's disease    Allergies  Allergen Reactions  . Ace Inhibitors   . Penicillins     Current Outpatient Prescriptions on File Prior to Visit  Medication Sig Dispense  Refill  . triamcinolone cream (KENALOG) 0.5 % apply to affected area twice a day  15 g  5  . valACYclovir (VALTREX) 1000 MG tablet take 1 tablet by mouth twice a day  20 tablet  1   Current Facility-Administered Medications on File Prior to Visit  Medication Dose Route Frequency Provider Last Rate Last Dose  . cloNIDine (CATAPRES) tablet 0.1 mg  0.1 mg Oral Once Timoteo Gaul, FNP        BP 142/90  Pulse 84  Temp(Src) 97.9 F (36.6 C) (Oral)  Ht 5\' 7"  (1.702 m)  Wt 172 lb (78.019 kg)  BMI 26.93 kg/m2    Objective:   Physical Exam  Constitutional: She is oriented to person, place, and time. She appears well-developed and well-nourished. No distress.  HENT:  Head: Normocephalic and atraumatic.  Cardiovascular: Normal rate, regular rhythm and normal heart sounds.   Pulmonary/Chest: Effort normal. She has no wheezes.  Abdominal: Soft. Bowel sounds are normal.  Musculoskeletal: She exhibits no edema.  Neurological: She is alert and oriented to person, place, and time. No cranial nerve deficit.  Psychiatric: She has a normal mood and affect.          Assessment & Plan:

## 2013-12-08 NOTE — Assessment & Plan Note (Signed)
Patient still working 2nd or 3rd shift. Continue zolpidem as needed.

## 2013-12-08 NOTE — Progress Notes (Signed)
Pre visit review using our clinic review tool, if applicable. No additional management support is needed unless otherwise documented below in the visit note. 

## 2013-12-11 LAB — HEPATIC FUNCTION PANEL
ALK PHOS: 90 U/L (ref 39–117)
ALT: 18 U/L (ref 0–35)
AST: 17 U/L (ref 0–37)
Albumin: 3.8 g/dL (ref 3.5–5.2)
BILIRUBIN DIRECT: 0 mg/dL (ref 0.0–0.3)
Total Bilirubin: 0.6 mg/dL (ref 0.3–1.2)
Total Protein: 7.4 g/dL (ref 6.0–8.3)

## 2013-12-11 LAB — LIPID PANEL
CHOL/HDL RATIO: 2
Cholesterol: 162 mg/dL (ref 0–200)
HDL: 83.7 mg/dL (ref >39.00)
LDL CALC: 64 mg/dL (ref 0–99)
Triglycerides: 73 mg/dL (ref 0.0–149.0)
VLDL: 14.6 mg/dL (ref 0.0–40.0)

## 2013-12-11 LAB — BASIC METABOLIC PANEL
BUN: 12 mg/dL (ref 6–23)
CHLORIDE: 104 meq/L (ref 96–112)
CO2: 28 meq/L (ref 19–32)
CREATININE: 0.8 mg/dL (ref 0.4–1.2)
Calcium: 10.2 mg/dL (ref 8.4–10.5)
GFR: 93.72 mL/min (ref >60.00)
Glucose, Bld: 83 mg/dL (ref 70–99)
POTASSIUM: 3.8 meq/L (ref 3.5–5.1)
SODIUM: 138 meq/L (ref 135–145)

## 2013-12-11 LAB — TSH: TSH: 2.28 u[IU]/mL (ref 0.35–5.50)

## 2014-01-15 ENCOUNTER — Other Ambulatory Visit (INDEPENDENT_AMBULATORY_CARE_PROVIDER_SITE_OTHER): Payer: PRIVATE HEALTH INSURANCE

## 2014-01-15 ENCOUNTER — Other Ambulatory Visit: Payer: Self-pay | Admitting: Internal Medicine

## 2014-01-15 DIAGNOSIS — Z20820 Contact with and (suspected) exposure to varicella: Secondary | ICD-10-CM

## 2014-01-15 DIAGNOSIS — Z23 Encounter for immunization: Secondary | ICD-10-CM

## 2014-01-15 DIAGNOSIS — Z111 Encounter for screening for respiratory tuberculosis: Secondary | ICD-10-CM

## 2014-01-16 LAB — MEASLES/MUMPS/RUBELLA IMMUNITY
Mumps IgG: 21.1 AU/mL — ABNORMAL HIGH (ref <9.00)
RUBEOLA IGG: 223 [AU]/ml — AB (ref <25.00)
Rubella: 4.43 Index — ABNORMAL HIGH (ref <0.90)

## 2014-01-16 LAB — HEPATITIS B CORE ANTIBODY, TOTAL: Hep B Core Total Ab: NONREACTIVE

## 2014-01-16 LAB — VARICELLA ZOSTER ANTIBODY, IGG: Varicella IgG: 878.8 Index — ABNORMAL HIGH (ref <135.00)

## 2014-01-18 LAB — TB SKIN TEST
Induration: 0 mm
TB Skin Test: NEGATIVE

## 2014-01-20 LAB — HEPATITIS B SURFACE ANTIBODY,QUALITATIVE: Hep B S Ab: POSITIVE — AB

## 2014-01-31 ENCOUNTER — Other Ambulatory Visit: Payer: Self-pay | Admitting: Internal Medicine

## 2014-02-05 ENCOUNTER — Encounter: Payer: Self-pay | Admitting: Internal Medicine

## 2014-02-05 ENCOUNTER — Ambulatory Visit (INDEPENDENT_AMBULATORY_CARE_PROVIDER_SITE_OTHER): Payer: PRIVATE HEALTH INSURANCE | Admitting: Internal Medicine

## 2014-02-05 VITALS — BP 112/66 | HR 80 | Temp 98.2°F | Ht 67.0 in | Wt 174.0 lb

## 2014-02-05 DIAGNOSIS — I10 Essential (primary) hypertension: Secondary | ICD-10-CM

## 2014-02-05 MED ORDER — VALSARTAN-HYDROCHLOROTHIAZIDE 320-12.5 MG PO TABS: ORAL_TABLET | ORAL | Status: DC

## 2014-02-05 MED ORDER — NEBIVOLOL HCL 10 MG PO TABS: 10.0000 mg | ORAL_TABLET | Freq: Every day | ORAL | Status: DC

## 2014-02-05 MED ORDER — SERTRALINE HCL 50 MG PO TABS: 50.0000 mg | ORAL_TABLET | Freq: Every day | ORAL | Status: DC

## 2014-02-05 NOTE — Assessment & Plan Note (Signed)
Good response to Bystolic.  Continue same regimen.  BP: 112/66 mmHg  Lab Results  Component Value Date   CREATININE 0.8 12/11/2013   Lab Results  Component Value Date   NA 138 12/11/2013   K 3.8 12/11/2013   CL 104 12/11/2013   CO2 28 12/11/2013

## 2014-02-05 NOTE — Progress Notes (Signed)
Pre visit review using our clinic review tool, if applicable. No additional management support is needed unless otherwise documented below in the visit note. 

## 2014-02-05 NOTE — Progress Notes (Signed)
Subjective:    Patient ID: Shannon [name], female    DOB: 30-Jul-1951, 63 y.o.   MRN: [id number]  HPI  63 year old [place] American female for followup regarding hypertension. Patient's blood pressure medication regimen adjusted at previous visit. She reports tolerating Bystolic 10 mg once daily. She denies any adverse effects. She reports her headaches are significantly improved since switching to Bystolic.   Review of Systems Negative for chest pain, negative for headache    Past Medical History  Diagnosis Date  . Allergy   . GERD (gastroesophageal reflux disease)   . Hypertension   . Headache(784.0)     History   Social History  . Marital Status: Married    Spouse Name: N/A    Number of Children: N/A  . Years of Education: N/A   Occupational History  . Not on file.   Social History Main Topics  . Smoking status: Never Smoker   . Smokeless tobacco: Not on file  . Alcohol Use: No  . Drug Use: Yes    Special: Marijuana  . Sexual Activity: Not on file   Other Topics Concern  . 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 Breast lumpectomy    . Tubal ligation      Family History  Problem Relation Age of Onset  . Hypertension Mother     age 22  . Alzheimer's disease Father     deceased secondary to alzheimer's disease    Allergies  Allergen Reactions  . Ace Inhibitors   . Penicillins     Current Outpatient Prescriptions on File Prior to Visit  Medication Sig Dispense Refill  . nebivolol (BYSTOLIC) 10 MG tablet Take 1 tablet (10 mg total) by mouth daily.  90 tablet  1  . sertraline (ZOLOFT) 50 MG tablet Take 1 tablet (50 mg total) by mouth daily.  90 tablet  1  . triamcinolone cream (KENALOG) 0.5 % apply to affected area twice a day  15 g  5    . valACYclovir (VALTREX) 1000 MG tablet take 1 tablet by mouth twice a day  20 tablet  1  . valsartan-hydrochlorothiazide (DIOVAN-HCT) 320-12.5 MG per tablet take 1 tablet by mouth once daily  90 tablet  1  . zolpidem (AMBIEN) 5 MG tablet Take 1 tablet (5 mg total) by mouth at bedtime as needed for sleep.  30 tablet  5   Current Facility-Administered Medications on File Prior to Visit  Medication Dose Route Frequency Provider Last Rate Last Dose  . cloNIDine (CATAPRES) tablet 0.1 mg  0.1 mg Oral Once Timoteo Gaul, FNP        BP 112/66  Pulse 80  Temp(Src) 98.2 F (36.8 C) (Oral)  Ht 5\' 7"  (1.702 m)  Wt 174 lb (78.926 kg)  BMI 27.25 kg/m2    Objective:   Physical Exam  Constitutional: She is oriented to person, place, and time. She appears well-developed and well-nourished. No distress.  Cardiovascular: Normal rate, regular rhythm and normal heart sounds.   No murmur heard. Pulmonary/Chest: Effort normal and breath sounds normal. She has no wheezes.  Musculoskeletal: She exhibits no edema.  Neurological: She is  alert and oriented to person, place, and time. No cranial nerve deficit.  Psychiatric: She has a normal mood and affect. Her behavior is normal.          Assessment & Plan:

## 2014-02-06 ENCOUNTER — Telehealth: Payer: Self-pay | Admitting: Internal Medicine

## 2014-02-06 MED ORDER — NEBIVOLOL HCL 10 MG PO TABS: 10.0000 mg | ORAL_TABLET | Freq: Every day | ORAL | Status: DC

## 2014-02-06 NOTE — Telephone Encounter (Signed)
Pt was seen yesterday, dr. Shawna Orleans stated he would provide rx fornebivolol (BYSTOLIC) 10 MG tablet, pt states pharmacy did not get the fax, please send to [facility] [address].

## 2014-02-06 NOTE — Telephone Encounter (Signed)
rx resent to [facility]

## 2014-02-06 NOTE — Telephone Encounter (Signed)
Opened in error

## 2014-03-26 ENCOUNTER — Ambulatory Visit (INDEPENDENT_AMBULATORY_CARE_PROVIDER_SITE_OTHER): Payer: PRIVATE HEALTH INSURANCE | Admitting: Internal Medicine

## 2014-03-26 DIAGNOSIS — Z111 Encounter for screening for respiratory tuberculosis: Secondary | ICD-10-CM

## 2014-03-28 LAB — TB SKIN TEST
INDURATION: 0 mm
TB Skin Test: NEGATIVE

## 2014-04-04 ENCOUNTER — Ambulatory Visit (INDEPENDENT_AMBULATORY_CARE_PROVIDER_SITE_OTHER): Payer: PRIVATE HEALTH INSURANCE | Admitting: *Deleted

## 2014-04-04 DIAGNOSIS — Z111 Encounter for screening for respiratory tuberculosis: Secondary | ICD-10-CM

## 2014-04-09 ENCOUNTER — Ambulatory Visit: Payer: PRIVATE HEALTH INSURANCE | Admitting: *Deleted

## 2014-05-25 ENCOUNTER — Encounter: Payer: Self-pay | Admitting: Women's Health

## 2014-05-25 ENCOUNTER — Ambulatory Visit (INDEPENDENT_AMBULATORY_CARE_PROVIDER_SITE_OTHER): Payer: PRIVATE HEALTH INSURANCE | Admitting: Women's Health

## 2014-05-25 DIAGNOSIS — B3731 Acute candidiasis of vulva and vagina: Secondary | ICD-10-CM

## 2014-05-25 DIAGNOSIS — B373 Candidiasis of vulva and vagina: Secondary | ICD-10-CM

## 2014-05-25 DIAGNOSIS — N898 Other specified noninflammatory disorders of vagina: Secondary | ICD-10-CM

## 2014-05-25 LAB — WET PREP FOR TRICH, YEAST, CLUE
Clue Cells Wet Prep HPF POC: NONE SEEN
Trich, Wet Prep: NONE SEEN

## 2014-05-25 MED ORDER — FLUCONAZOLE 150 MG PO TABS: 150.0000 mg | ORAL_TABLET | Freq: Once | ORAL | Status: DC

## 2014-05-25 NOTE — Patient Instructions (Signed)

## 2014-05-25 NOTE — Progress Notes (Signed)
Patient ID: Shannon [name], female   DOB: [birth year], 63 y.o.   MRN: [id number] Presents with complaint of vaginal discharge with minimal itching or odor for one week. Denies urinary symptoms, abdominal pain or fever. Postmenopausal/no HRT or bleeding. 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  Exam: Appears well. External genitalia within normal limits, speculum exam scant white discharge, wet prep positive for yeast. Bimanual no CMT or adnexal fullness or tenderness.  Yeast vaginitis  Plan: Diflucan 150 by mouth x1 dose prescription, proper use, yeast prevention discussed. Instructed to call if no relief. Instructed to keep scheduled annual exam.

## 2014-06-19 ENCOUNTER — Ambulatory Visit (INDEPENDENT_AMBULATORY_CARE_PROVIDER_SITE_OTHER): Payer: PRIVATE HEALTH INSURANCE | Admitting: Gynecology

## 2014-06-19 ENCOUNTER — Encounter: Payer: Self-pay | Admitting: Gynecology

## 2014-06-19 ENCOUNTER — Other Ambulatory Visit (HOSPITAL_COMMUNITY)
Admission: RE | Admit: 2014-06-19 | Discharge: 2014-06-19 | Disposition: A | Discharge status: Home / Self Care | Payer: PRIVATE HEALTH INSURANCE | Source: Ambulatory Visit | Attending: Gynecology | Admitting: Gynecology

## 2014-06-19 VITALS — BP 132/84 | Ht 64.5 in | Wt 174.0 lb

## 2014-06-19 DIAGNOSIS — Z01419 Encounter for gynecological examination (general) (routine) without abnormal findings: Secondary | ICD-10-CM | POA: Diagnosis present

## 2014-06-19 DIAGNOSIS — R87810 Cervical high risk human papillomavirus (HPV) DNA test positive: Secondary | ICD-10-CM | POA: Insufficient documentation

## 2014-06-19 DIAGNOSIS — IMO0002 Reserved for concepts with insufficient information to code with codable children: Secondary | ICD-10-CM

## 2014-06-19 DIAGNOSIS — Z1151 Encounter for screening for human papillomavirus (HPV): Secondary | ICD-10-CM | POA: Insufficient documentation

## 2014-06-19 DIAGNOSIS — N952 Postmenopausal atrophic vaginitis: Secondary | ICD-10-CM

## 2014-06-19 DIAGNOSIS — Z780 Asymptomatic menopausal state: Secondary | ICD-10-CM

## 2014-06-19 DIAGNOSIS — N951 Menopausal and female climacteric states: Secondary | ICD-10-CM

## 2014-06-19 NOTE — Progress Notes (Signed)
Shannon [name] 1951/01/10 [id number]   History:    63 y.o.  for annual gyn exam who has not been seen in the office since 2012. Patient's PCP is Dr. Shawna Orleans who has been treating the patient for hypertension and has been doing her blood work. Patient with no previous abnormal Pap smears. Her bone density in 2011 was normal. 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 It was noted on her past medical records that she had a fibroid uterus back in 2012 and she's had no problems. No vaginal bleeding reported. She recent menopause at the age of 83 and has never been on hormone replacement therapy. All her vaccines are up to date except the shingles are seen. She occasionally suffer from vaginal dryness but uses topical lubricants during intercourse.  Patient with history in 1978 and having had a left breast cyst removed which was benign.  Past medical history,surgical history, family history and social history were all reviewed and documented in the EPIC chart.  Gynecologic History No LMP recorded. Patient is postmenopausal. Contraception: post menopausal status Last Pap: 2011. Results were: normal Last mammogram: 2013. Results were: Normal but dense  Obstetric History OB History  Gravida Para Term Preterm AB SAB TAB Ectopic Multiple Living  3 2   1     2     # Outcome Date GA Lbr Len/2nd Weight Sex Delivery Anes PTL Lv  3 ABT           2 PAR           1 PAR                ROS: A ROS was performed and pertinent positives and negatives are included in the history.  GENERAL: No fevers or chills. HEENT: No change in vision, no earache, sore throat or sinus congestion. NECK: No pain or stiffness. CARDIOVASCULAR: No chest pain or pressure. No palpitations. PULMONARY: No shortness of breath, cough or wheeze. GASTROINTESTINAL: No abdominal pain, nausea, vomiting or diarrhea, melena or bright  red blood per rectum. GENITOURINARY: No urinary frequency, urgency, hesitancy or dysuria. MUSCULOSKELETAL: No joint or muscle pain, no back pain, no recent trauma. DERMATOLOGIC: No rash, no itching, no lesions. ENDOCRINE: No polyuria, polydipsia, no heat or cold intolerance. No recent change in weight. HEMATOLOGICAL: No anemia or easy bruising or bleeding. NEUROLOGIC: No headache, seizures, numbness, tingling or weakness. PSYCHIATRIC: No depression, no loss of interest in normal activity or change in sleep pattern.     Exam: chaperone present  BP 132/84  Ht 5' 4.5" (1.638 m)  Wt 174 lb (78.926 kg)  BMI 29.42 kg/m2  Body mass index is 29.42 kg/(m^2).  General appearance : Well developed well nourished female. No acute distress HEENT: Neck supple, trachea midline, no carotid bruits, no thyroidmegaly Lungs: Clear to auscultation, no rhonchi or wheezes, or rib retractions  Heart: Regular rate and rhythm, no murmurs or gallops Breast:Examined in sitting and supine position were symmetrical in appearance, no palpable masses or tenderness,  no skin retraction, no nipple inversion, no nipple discharge, no skin discoloration, no axillary or supraclavicular lymphadenopathy Abdomen: no palpable masses or tenderness, no rebound or guarding Extremities: no edema or skin discoloration or tenderness  Pelvic:  Bartholin, Urethra, Skene Glands: Within normal limits             Vagina: No gross lesions  or discharge  Cervix: No gross lesions or discharge  Uterus  irregular anteverted upper limits of normal nontender  Adnexa  Without masses or tenderness  Anus and perineum  normal   Rectovaginal  normal sphincter tone without palpated masses or tenderness             Hemoccult: Cards provided by her PCP  Assessment/Plan:  63 y.o. female for annual edoing well menopausal with no symptoms. Patient not interested in vaginal estrogen at the present time. Information provided. She was scheduled for overdue  mammogram. She was reminded to request a 3-dimensional mammograms. Requisition provided. She will schedule a bone density study here in our office in the next few weeks. Pap smear was done today. PCP will be drawn her blood work. Was provided with a requisition to go to her local pharmacy to obtain the shingles vaccine.    Note: This dictation was prepared with  Dragon/digital dictation along withSmart phrase technology. Any transcriptional errors that result from this process are unintentional.   Terrance Mass MD, 3:16 PM 06/19/2014

## 2014-06-19 NOTE — Patient Instructions (Signed)
Bone Densitometry Bone densitometry is a special X-ray that measures your bone density and can be used to help predict your risk of bone fractures. This test is used to determine bone mineral content and density to diagnose osteoporosis. Osteoporosis is the loss of bone that may cause the bone to become weak. Osteoporosis commonly occurs in women entering menopause. However, it may be found in men and in people with other diseases. PREPARATION FOR TEST No preparation necessary. WHO SHOULD BE TESTED?  All women older than 84.  Postmenopausal women (50 to 75) with risk factors for osteoporosis.  People with a previous fracture caused by normal activities.  People with a small body frame (less than 127 poundsor a body mass index [BMI] of less than 21).  People who have a parent with a hip fracture or history of osteoporosis.  People who smoke.  People who have rheumatoid arthritis.  Anyone who engages in excessive alcohol use (more than 3 drinks most days).  Women who experience early menopause. WHEN SHOULD YOU BE RETESTED? Current guidelines suggest that you should wait at least 2 years before doing a bone density test again if your first test was normal.Recent studies indicated that women with normal bone density may be able to wait a few years before needing to repeat a bone density test. You should discuss this with your caregiver.  NORMAL FINDINGS   Normal: less than standard deviation below normal (greater than -1).  Osteopenia: 1 to 2.5 standard deviations below normal (-1 to -2.5).  Osteoporosis: greater than 2.5 standard deviations below normal (less than -2.5). Test results are reported as a "T score" and a "Z score."The T score is a number that compares your bone density with the bone density of healthy, young women.The Z score is a number that compares your bone density with the scores of women who are the same age, gender, and race.  Ranges for normal findings may vary  among different laboratories and hospitals. You should always check with your doctor after having lab work or other tests done to discuss the meaning of your test results and whether your values are considered within normal limits. MEANING OF TEST  Your caregiver will go over the test results with you and discuss the importance and meaning of your results, as well as treatment options and the need for additional tests if necessary. OBTAINING THE TEST RESULTS It is your responsibility to obtain your test results. Ask the lab or department performing the test when and how you will get your results. Document Released: 11/17/2004 Document Revised: 01/18/2012 Document Reviewed: 12/10/2010 [facility] Patient Information 2015 [place], [place]. This information is not intended to replace advice given to you by your health care provider. Make sure you discuss any questions you have with your health care provider. Shingles Shingles (herpes zoster) is an infection that is caused by the same virus that causes chickenpox (varicella). The infection causes a painful skin rash and fluid-filled blisters, which eventually break open, crust over, and heal. It may occur in any area of the body, but it usually affects only one side of the body or face. The pain of shingles usually lasts about 1 month. However, some people with shingles may develop long-term (chronic) pain in the affected area of the body. Shingles often occurs many years after the person had chickenpox. It is more common:  In people older than 50 years.  In people with weakened immune systems, such as those with HIV, AIDS, or cancer.  In people taking medicines that weaken the immune system, such as transplant medicines.  In people under great stress. CAUSES  Shingles is caused by the varicella zoster virus (VZV), which also causes chickenpox. After a person is infected with the virus, it can remain in the person's body for years in an inactive state  (dormant). To cause shingles, the virus reactivates and breaks out as an infection in a nerve root. The virus can be spread from person to person (contagious) through contact with open blisters of the shingles rash. It will only spread to people who have not had chickenpox. When these people are exposed to the virus, they may develop chickenpox. They will not develop shingles. Once the blisters scab over, the person is no longer contagious and cannot spread the virus to others. SIGNS AND SYMPTOMS  Shingles shows up in stages. The initial symptoms may be pain, itching, and tingling in an area of the skin. This pain is usually described as burning, stabbing, or throbbing.In a few days or weeks, a painful red rash will appear in the area where the pain, itching, and tingling were felt. The rash is usually on one side of the body in a band or belt-like pattern. Then, the rash usually turns into fluid-filled blisters. They will scab over and dry up in approximately 2-3 weeks. Flu-like symptoms may also occur with the initial symptoms, the rash, or the blisters. These may include:  Fever.  Chills.  Headache.  Upset stomach. DIAGNOSIS  Your health care provider will perform a skin exam to diagnose shingles. Skin scrapings or fluid samples may also be taken from the blisters. This sample will be examined under a microscope or sent to a lab for further testing. TREATMENT  There is no specific cure for shingles. Your health care provider will likely prescribe medicines to help you manage the pain, recover faster, and avoid long-term problems. This may include antiviral drugs, anti-inflammatory drugs, and pain medicines. HOME CARE INSTRUCTIONS   Take a cool bath or apply cool compresses to the area of the rash or blisters as directed. This may help with the pain and itching.   Take medicines only as directed by your health care provider.   Rest as directed by your health care provider.  Keep your  rash and blisters clean with mild soap and cool water or as directed by your health care provider.  Do not pick your blisters or scratch your rash. Apply an anti-itch cream or numbing creams to the affected area as directed by your health care provider.  Keep your shingles rash covered with a loose bandage (dressing).  Avoid skin contact with:  Babies.   Pregnant women.   Children with eczema.   Elderly people with transplants.   People with chronic illnesses, such as leukemia or AIDS.   Wear loose-fitting clothing to help ease the pain of material rubbing against the rash.  Keep all follow-up visits as directed by your health care provider.If the area involved is on your face, you may receive a referral for a specialist, such as an eye doctor (ophthalmologist) or an ear, nose, and throat (ENT) doctor. Keeping all follow-up visits will help you avoid eye problems, chronic pain, or disability.  SEEK IMMEDIATE MEDICAL CARE IF:   You have facial pain, pain around the eye area, or loss of feeling on one side of your face.  You have ear pain or ringing in your ear.  You have loss of taste.  Your pain  is not relieved with prescribed medicines.   Your redness or swelling spreads.   You have more pain and swelling.  Your condition is worsening or has changed.   You have a fever. MAKE SURE YOU:  Understand these instructions.  Will watch your condition.  Will get help right away if you are not doing well or get worse. Document Released: 10/26/2005 Document Revised: 03/12/2014 Document Reviewed: 06/09/2012 [facility] Patient Information 2015 [place], [place]. This information is not intended to replace advice given to you by your health care provider. Make sure you discuss any questions you have with your health care provider.

## 2014-06-21 LAB — CYTOLOGY - PAP

## 2014-06-22 ENCOUNTER — Encounter: Payer: Self-pay | Admitting: Obstetrics and Gynecology

## 2014-08-04 ENCOUNTER — Other Ambulatory Visit: Payer: Self-pay | Admitting: Internal Medicine

## 2014-08-10 ENCOUNTER — Ambulatory Visit: Payer: PRIVATE HEALTH INSURANCE | Admitting: Internal Medicine

## 2014-09-10 ENCOUNTER — Encounter: Payer: Self-pay | Admitting: Gynecology

## 2014-09-12 ENCOUNTER — Ambulatory Visit (INDEPENDENT_AMBULATORY_CARE_PROVIDER_SITE_OTHER): Payer: 59 | Admitting: Internal Medicine

## 2014-09-12 ENCOUNTER — Encounter: Payer: Self-pay | Admitting: Internal Medicine

## 2014-09-12 VITALS — BP 144/92 | HR 60 | Temp 97.9°F | Ht 64.5 in | Wt 179.0 lb

## 2014-09-12 DIAGNOSIS — G4700 Insomnia, unspecified: Secondary | ICD-10-CM

## 2014-09-12 DIAGNOSIS — Z23 Encounter for immunization: Secondary | ICD-10-CM

## 2014-09-12 DIAGNOSIS — I10 Essential (primary) hypertension: Secondary | ICD-10-CM

## 2014-09-12 MED ORDER — NEBIVOLOL HCL 10 MG PO TABS: 10.0000 mg | ORAL_TABLET | Freq: Every day | ORAL | Status: DC

## 2014-09-12 MED ORDER — VALSARTAN-HYDROCHLOROTHIAZIDE 320-12.5 MG PO TABS: ORAL_TABLET | ORAL | Status: DC

## 2014-09-12 NOTE — Patient Instructions (Signed)
Please complete the following lab tests before your next follow up appointment: CPX labs before next office visit (include vitamin D level)

## 2014-09-12 NOTE — Progress Notes (Signed)
   Subjective:    Patient ID: Shannon [name], female    DOB: 07/16/1951, 63 y.o.   MRN: [id number]  HPI  63 year old American female with history of hypertension and chronic insomnia for follow-up. Patient denies any significant interval medical history. Patient reports she monitors her blood pressure at home. Her systolic blood pressure usually between 120 and 563 and diastolic blood pressure low 80s.  She has gained several pounds since previous visit. Patient working the night shift. She has difficulty maintaining weight when she is working night shift. She eats more sweets.  Patient also under more stress taking care of her husband.   Review of Systems Negative for chest pain or shortness of breath    Past Medical History  Diagnosis Date  . Allergy   . GERD (gastroesophageal reflux disease)   . Hypertension   . Headache(784.0)     History   Social History  . Marital Status: Married    Spouse Name: N/A    Number of Children: N/A  . Years of Education: N/A   Occupational History  . Not on file.   Social History Main Topics  . Smoking status: Never Smoker   . Smokeless tobacco: Not on file  . Alcohol Use: No  . Drug Use: Yes    Special: Marijuana  . Sexual Activity: Not on file   Other Topics Concern  . 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 Breast lumpectomy    . Tubal ligation      Family History  Problem Relation Age of Onset  . Hypertension Mother     age 52  . Alzheimer's disease Father     deceased secondary to alzheimer's disease    Allergies  Allergen Reactions  . Penicillins Shortness Of Breath  . Corn-Containing Products Itching    Current Outpatient Prescriptions on File Prior to Visit  Medication Sig Dispense Refill  .  zolpidem (AMBIEN) 5 MG tablet take 1 tablet by mouth at bedtime if needed for sleep 30 tablet 2   Current Facility-Administered Medications on File Prior to Visit  Medication Dose Route Frequency Provider Last Rate Last Dose  . cloNIDine (CATAPRES) tablet 0.1 mg  0.1 mg Oral Once Timoteo Gaul, FNP        BP 144/92 mmHg  Pulse 60  Temp(Src) 97.9 F (36.6 C) (Oral)  Ht 5' 4.5" (1.638 m)  Wt 179 lb (81.194 kg)  BMI 30.26 kg/m2    Objective:   Physical Exam  Constitutional: She is oriented to person, place, and time. She appears well-developed and well-nourished.  Cardiovascular: Normal rate, regular rhythm and normal heart sounds.   No murmur heard. Pulmonary/Chest: Effort normal and breath sounds normal. She has no wheezes.  Musculoskeletal: She exhibits no edema.  Neurological: She is alert and oriented to person, place, and time. No cranial nerve deficit.  Psychiatric: She has a normal mood and affect. Her behavior is normal.        Assessment & Plan:

## 2014-09-12 NOTE — Progress Notes (Signed)
Pre visit review using our clinic review tool, if applicable. No additional management support is needed unless otherwise documented below in the visit note. 

## 2014-09-12 NOTE — Assessment & Plan Note (Signed)
Insomnia associated with shift work. Continue zolpidem as needed.

## 2014-09-12 NOTE — Assessment & Plan Note (Signed)
Patient's blood pressure sporadically elevated today. Patient reports home blood pressure readings are normal. Continue same medication regimen. Monitor electrolytes and kidney function. BP: (!) 144/92 mmHg

## 2014-09-13 ENCOUNTER — Telehealth: Payer: Self-pay | Admitting: Internal Medicine

## 2014-09-13 ENCOUNTER — Ambulatory Visit: Payer: PRIVATE HEALTH INSURANCE | Admitting: Internal Medicine

## 2014-09-13 NOTE — Telephone Encounter (Signed)
emmi emailed °

## 2014-11-26 ENCOUNTER — Ambulatory Visit (INDEPENDENT_AMBULATORY_CARE_PROVIDER_SITE_OTHER): Payer: 59 | Admitting: Podiatry

## 2014-11-26 ENCOUNTER — Encounter: Payer: Self-pay | Admitting: Podiatry

## 2014-11-26 DIAGNOSIS — L84 Corns and callosities: Secondary | ICD-10-CM

## 2014-11-26 NOTE — Patient Instructions (Signed)
Corns and Calluses Corns are small areas of thickened skin that usually occur on the top, sides, or tip of a toe. They contain a cone-shaped core with a point that can press on a nerve below. This causes pain. Calluses are areas of thickened skin that usually develop on hands, fingers, palms, soles of the feet, and heels. These are areas that experience frequent friction or pressure. CAUSES  Corns are usually the result of rubbing (friction) or pressure from shoes that are too tight or do not fit properly. Calluses are caused by repeated friction and pressure on the affected areas. SYMPTOMS  A hard growth on the skin.  Pain or tenderness under the skin.  Sometimes, redness and swelling.  Increased discomfort while wearing tight-fitting shoes. DIAGNOSIS  Your caregiver can usually tell what the problem is by doing a physical exam. TREATMENT  Removing the cause of the friction or pressure is usually the only treatment needed. However, sometimes medicines can be used to help soften the hardened, thickened areas. These medicines include salicylic acid plasters and 12% ammonium lactate lotion. These medicines should only be used under the direction of your caregiver. HOME CARE INSTRUCTIONS   Try to remove pressure from the affected area.  You may wear donut-shaped corn pads to protect your skin.  You may use a pumice stone or nonmetallic nail file to gently reduce the thickness of a corn.  Wear properly fitted footwear.  If you have calluses on the hands, wear gloves during activities that cause friction.  If you have diabetes, you should regularly examine your feet. Tell your caregiver if you notice any problems with your feet. SEEK IMMEDIATE MEDICAL CARE IF:   You have increased pain, swelling, redness, or warmth in the affected area.  Your corn or callus starts to drain fluid or bleeds.  You are not getting better, even with treatment. Document Released: 08/01/2004 Document  Revised: 01/18/2012 Document Reviewed: 06/23/2011 [employer] Patient Information 2015 [employer]. This information is not intended to replace advice given to you by your health care provider. Make sure you discuss any questions you have with your health care provider.  

## 2014-11-26 NOTE — Progress Notes (Signed)
   Subjective:    Patient ID: Shannon [name], female    DOB: Jul 31, 1951, 64 y.o.   MRN: [id number]  HPI Comments: N callouses L right 1st medial toe, and left 5th plantar foot D right - 1 month, left - 2 - 3 years O on going C hard painful skin A walking and enclosed shoes T after shower uses pumus  Patient's concern is the painful callus on the plantar fifth left MPJ. She describes self treatment primarily and mentions a visit to a podiatrist in the past.   Review of Systems  All other systems reviewed and are negative.  Patient works in [employer], manufacturing in [employer]    Objective:   Physical Exam Orientated 3  Vascular: DP pulses 2/4 bilaterally PT pulses 2/4 bilaterally Capillary reflex immediate bilaterally  Neurological: Sensation to 10 g monofilament wire intact 5/5 bilaterally Vibratory sensation intact bilaterally Ankle reflex equal and reactive bilaterally  Dermatological: Texture and turgor within normal limits Diffuse plantar callus sub-fifth left MPJ  Musculoskeletal: Transverse medial drifting of MPJ 2-4 bilaterally No restriction ankle, subtalar, midtarsal joints, metatarsophalangeal joints bilaterally Mildly plantar flexed fifth metatarsal head with foot in neutral position. Left foot       Assessment & Plan:   Assessment: Satisfactory neurovascular status Plantarflexed fifth metatarsal head with associated plantar callus, left foot  Plan: Patient advised that callus sub-fifth MPJ was from uneven pressure and I recommend periodic debridement and protective padding. I debrided plantar callus today and attached a felt pad to the insole of her shoe to pocket the fifth left MPJ  Return as needed

## 2014-12-29 ENCOUNTER — Other Ambulatory Visit: Payer: Self-pay | Admitting: Internal Medicine

## 2015-01-04 ENCOUNTER — Other Ambulatory Visit (INDEPENDENT_AMBULATORY_CARE_PROVIDER_SITE_OTHER): Payer: 59

## 2015-01-04 DIAGNOSIS — Z0000 Encounter for general adult medical examination without abnormal findings: Secondary | ICD-10-CM

## 2015-01-04 LAB — CBC WITH DIFFERENTIAL/PLATELET
Basophils Absolute: 0 10*3/uL (ref 0.0–0.1)
Basophils Relative: 0.5 % (ref 0.0–3.0)
Eosinophils Absolute: 0.1 10*3/uL (ref 0.0–0.7)
Eosinophils Relative: 1.2 % (ref 0.0–5.0)
HCT: 40.7 % (ref 36.0–46.0)
Hemoglobin: 13.5 g/dL (ref 12.0–15.0)
LYMPHS PCT: 32.1 % (ref 12.0–46.0)
Lymphs Abs: 1.8 10*3/uL (ref 0.7–4.0)
MCHC: 33.2 g/dL (ref 30.0–36.0)
MCV: 91.1 fl (ref 78.0–100.0)
MONO ABS: 0.4 10*3/uL (ref 0.1–1.0)
Monocytes Relative: 7.4 % (ref 3.0–12.0)
NEUTROS PCT: 58.8 % (ref 43.0–77.0)
Neutro Abs: 3.3 10*3/uL (ref 1.4–7.7)
PLATELETS: 448 10*3/uL — AB (ref 150.0–400.0)
RBC: 4.46 Mil/uL (ref 3.87–5.11)
RDW: 14 % (ref 11.5–15.5)
WBC: 5.6 10*3/uL (ref 4.0–10.5)

## 2015-01-04 LAB — POCT URINALYSIS DIPSTICK
BILIRUBIN UA: NEGATIVE
GLUCOSE UA: NEGATIVE
Ketones, UA: NEGATIVE
Nitrite, UA: NEGATIVE
Protein, UA: NEGATIVE
SPEC GRAV UA: 1.015
Urobilinogen, UA: 0.2
pH, UA: 7.5

## 2015-01-04 LAB — HEPATIC FUNCTION PANEL
ALK PHOS: 107 U/L (ref 39–117)
ALT: 12 U/L (ref 0–35)
AST: 11 U/L (ref 0–37)
Albumin: 4 g/dL (ref 3.5–5.2)
BILIRUBIN TOTAL: 0.4 mg/dL (ref 0.2–1.2)
Bilirubin, Direct: 0.1 mg/dL (ref 0.0–0.3)
Total Protein: 6.9 g/dL (ref 6.0–8.3)

## 2015-01-04 LAB — BASIC METABOLIC PANEL
BUN: 8 mg/dL (ref 6–23)
CALCIUM: 10.3 mg/dL (ref 8.4–10.5)
CO2: 31 mEq/L (ref 19–32)
Chloride: 104 mEq/L (ref 96–112)
Creatinine, Ser: 0.81 mg/dL (ref 0.40–1.20)
GFR: 92.06 mL/min (ref >60.00)
Glucose, Bld: 93 mg/dL (ref 70–99)
Potassium: 4.1 mEq/L (ref 3.5–5.1)
SODIUM: 139 meq/L (ref 135–145)

## 2015-01-04 LAB — LIPID PANEL
CHOLESTEROL: 157 mg/dL (ref 0–200)
HDL: 72.1 mg/dL (ref >39.00)
LDL Cholesterol: 73 mg/dL (ref 0–99)
NonHDL: 84.9
Total CHOL/HDL Ratio: 2
Triglycerides: 60 mg/dL (ref 0.0–149.0)
VLDL: 12 mg/dL (ref 0.0–40.0)

## 2015-01-04 LAB — TSH: TSH: 0.53 u[IU]/mL (ref 0.35–4.50)

## 2015-01-04 LAB — VITAMIN D 25 HYDROXY (VIT D DEFICIENCY, FRACTURES): VITD: 10.55 ng/mL — ABNORMAL LOW (ref 30.00–100.00)

## 2015-01-11 ENCOUNTER — Encounter: Payer: Self-pay | Admitting: Internal Medicine

## 2015-01-11 ENCOUNTER — Ambulatory Visit (INDEPENDENT_AMBULATORY_CARE_PROVIDER_SITE_OTHER): Payer: 59 | Admitting: Internal Medicine

## 2015-01-11 VITALS — BP 110/62 | HR 64 | Temp 98.5°F | Wt 168.0 lb

## 2015-01-11 DIAGNOSIS — F430 Acute stress reaction: Secondary | ICD-10-CM

## 2015-01-11 DIAGNOSIS — I10 Essential (primary) hypertension: Secondary | ICD-10-CM

## 2015-01-11 DIAGNOSIS — G4700 Insomnia, unspecified: Secondary | ICD-10-CM

## 2015-01-11 MED ORDER — ZOLPIDEM TARTRATE 5 MG PO TABS: ORAL_TABLET | ORAL | Status: DC

## 2015-01-11 MED ORDER — SERTRALINE HCL 50 MG PO TABS: 50.0000 mg | ORAL_TABLET | Freq: Every day | ORAL | Status: DC

## 2015-01-11 MED ORDER — NEBIVOLOL HCL 10 MG PO TABS: 10.0000 mg | ORAL_TABLET | Freq: Every day | ORAL | Status: DC

## 2015-01-11 MED ORDER — VALSARTAN-HYDROCHLOROTHIAZIDE 320-12.5 MG PO TABS: ORAL_TABLET | ORAL | Status: DC

## 2015-01-11 NOTE — Progress Notes (Signed)
   Subjective:    Patient ID: Shannon [name], female    DOB: [birth year], 64 y.o.   MRN: [id number]  HPI  64 year old African-American female with history of hypertension, chronic insomnia and stress reaction for follow-up. Overall, patient has been doing well. She has lost 7 pounds since previous visit. Patient reports her appetite has decreased. She attributes change in appetite to life stressors at home.  Patient also unhappy with her current occupation. 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  Hypertension-stable  Review of Systems Negative for chest pain or shortness of breath    Past Medical History  Diagnosis Date  . Allergy   . GERD (gastroesophageal reflux disease)   . Hypertension   . Headache(784.0)     History   Social History  . Marital Status: Married    Spouse Name: N/A  . Number of Children: N/A  . Years of Education: N/A   Occupational History  . Not on file.   Social History Main Topics  . Smoking status: Never Smoker   . Smokeless tobacco: Not on file  . Alcohol Use: No  . Drug Use: Yes    Special: Marijuana  . Sexual Activity: Not on file   Other Topics Concern  . 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 Breast lumpectomy    . Tubal ligation      Family History  Problem Relation Age of Onset  . Hypertension Mother     age 1  . Alzheimer's disease Father     deceased secondary to alzheimer's disease    Allergies  Allergen Reactions  . Penicillins Shortness Of Breath  . Corn-Containing Products Itching    No current outpatient prescriptions on file prior to visit.   Current Facility-Administered Medications on File Prior to Visit  Medication Dose Route Frequency Provider Last  Rate Last Dose  . cloNIDine (CATAPRES) tablet 0.1 mg  0.1 mg Oral Once Timoteo Gaul, FNP        BP 110/62 mmHg  Pulse 64  Temp(Src) 98.5 F (36.9 C) (Oral)  Wt 168 lb (76.204 kg)    Objective:   Physical Exam  Constitutional: She is oriented to person, place, and time. She appears well-developed and well-nourished.  Cardiovascular: Normal rate and normal heart sounds.   Pulmonary/Chest: Effort normal and breath sounds normal. She has no wheezes.  Musculoskeletal: She exhibits no edema.  Neurological: She is alert and oriented to person, place, and time.  Skin: Skin is warm and dry.  Psychiatric: She has a normal mood and affect. Her behavior is normal.          Assessment & Plan:

## 2015-01-11 NOTE — Assessment & Plan Note (Signed)
Well controlled.  No change in therapy. BP: 110/62 mmHg

## 2015-01-11 NOTE — Patient Instructions (Signed)
Ask your insurance company about coverage for shingles vaccine

## 2015-01-11 NOTE — Progress Notes (Signed)
Pre visit review using our clinic review tool, if applicable. No additional management support is needed unless otherwise documented below in the visit note. 

## 2015-01-11 NOTE — Assessment & Plan Note (Signed)
Continue zolpidem.  She is working on changing jobs and plans to work day shift.

## 2015-01-11 NOTE — Assessment & Plan Note (Signed)
Continue sertraline.  We discussed coping strategies.

## 2015-01-15 ENCOUNTER — Other Ambulatory Visit: Payer: Self-pay | Admitting: Internal Medicine

## 2015-04-01 ENCOUNTER — Telehealth: Payer: Self-pay

## 2015-04-01 NOTE — Telephone Encounter (Signed)
Left message for pt to call back concerning mammogram

## 2015-07-22 ENCOUNTER — Other Ambulatory Visit: Payer: Self-pay | Admitting: Internal Medicine

## 2015-10-12 ENCOUNTER — Other Ambulatory Visit: Payer: Self-pay | Admitting: Internal Medicine

## 2016-01-30 ENCOUNTER — Telehealth: Payer: Self-pay | Admitting: Internal Medicine

## 2016-01-30 MED ORDER — VALSARTAN-HYDROCHLOROTHIAZIDE 320-12.5 MG PO TABS: ORAL_TABLET | ORAL | Status: DC

## 2016-01-30 NOTE — Telephone Encounter (Signed)
Refill sent.

## 2016-01-30 NOTE — Telephone Encounter (Signed)
Pt request refill of the following: valsartan-hydrochlorothiazide (DIOVAN-HCT) 320-12.5 MG per tablet  The above med was sent in on 01/11/16 pt said the pharmacy said they did not received. Can this be sent again please    Phamacy:   [facility]

## 2016-04-29 ENCOUNTER — Telehealth: Payer: Self-pay | Admitting: *Deleted

## 2016-04-29 NOTE — Telephone Encounter (Signed)
Received fax refill request for Triamcinolone 0.5% cream.  Faxed request back to pharmacy declining refill request, Rx not on current medication list and patient has not been seen in over 1 year.  Patient needs an appt.

## 2016-07-30 ENCOUNTER — Ambulatory Visit (INDEPENDENT_AMBULATORY_CARE_PROVIDER_SITE_OTHER): Payer: PRIVATE HEALTH INSURANCE | Admitting: Adult Health

## 2016-07-30 ENCOUNTER — Encounter: Payer: Self-pay | Admitting: Adult Health

## 2016-07-30 ENCOUNTER — Other Ambulatory Visit: Payer: Self-pay | Admitting: Adult Health

## 2016-07-30 VITALS — BP 150/92 | Temp 98.6°F | Ht 67.0 in | Wt 145.6 lb

## 2016-07-30 DIAGNOSIS — I10 Essential (primary) hypertension: Secondary | ICD-10-CM | POA: Diagnosis not present

## 2016-07-30 DIAGNOSIS — Z7189 Other specified counseling: Secondary | ICD-10-CM

## 2016-07-30 DIAGNOSIS — E559 Vitamin D deficiency, unspecified: Secondary | ICD-10-CM

## 2016-07-30 DIAGNOSIS — Z7689 Persons encountering health services in other specified circumstances: Secondary | ICD-10-CM

## 2016-07-30 MED ORDER — NEBIVOLOL HCL 10 MG PO TABS: 10.0000 mg | ORAL_TABLET | Freq: Every day | ORAL | 3 refills | Status: DC

## 2016-07-30 NOTE — Progress Notes (Signed)
Patient presents to clinic today to establish care. She is a pleasant 65 year old female who  has a past medical history of Allergy; GERD (gastroesophageal reflux disease); Headache(784.0); and Hypertension.  Last Physical was in February 2016 with Md Shawna Orleans  Acute Concerns: Establish Care  Chronic Issues: Hypertension  - Her current regimen includes that of Bystolic and Diovan-HCT. She has been out of Bystolic for two months. BP in the office today is 150/92. She denies headaches or blurred vision.   Health Maintenance: Dental --Routine Care Vision -- Routine Care Immunizations -- Declined flu  Colonoscopy -- 2015 - Normal  Mammogram -- 12/2014 PAP -- 2016 Bone Density -- Never had - Will have at mammogram  Diet: She eats lean meats and fruits and vegetables. No pork or red meat Exercise: Walks during the weekend.    Past Medical History:  Diagnosis Date  . Allergy   . GERD (gastroesophageal reflux disease)   . Headache(784.0)   . Hypertension     Past Surgical History:  Procedure Laterality Date  . BREAST LUMPECTOMY    . TUBAL LIGATION      Current Outpatient Prescriptions on File Prior to Visit  Medication Sig Dispense Refill  . BYSTOLIC 10 MG tablet take 1 tablet by mouth once daily 90 tablet 1  . sertraline (ZOLOFT) 50 MG tablet Take 1 tablet (50 mg total) by mouth daily. 90 tablet 1  . valsartan-hydrochlorothiazide (DIOVAN-HCT) 320-12.5 MG tablet take 1 tablet by mouth once daily 90 tablet 1  . zolpidem (AMBIEN) 5 MG tablet take 1 tablet by mouth at bedtime if needed for sleep 30 tablet 5   No current facility-administered medications on file prior to visit.     Allergies  Allergen Reactions  . Penicillins Shortness Of Breath  . Corn-Containing Products Itching    Family History  Problem Relation Age of Onset  . Hypertension Mother     age 21  . Alzheimer's disease Father     deceased secondary to alzheimer's disease    Social History   Social  History  . Marital status: Married    Spouse name: N/A  . Number of children: N/A  . Years of education: N/A   Occupational History  . Not on file.   Social History Main Topics  . Smoking status: Never Smoker  . Smokeless tobacco: Not on file  . Alcohol use No  . Drug use:     Types: Marijuana  . Sexual activity: Not on file   Other Topics Concern  . 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   HENT: Negative.   Eyes: Negative.   Respiratory: Negative.   Cardiovascular: Negative.   Gastrointestinal: Negative.   Genitourinary: Negative.   Musculoskeletal: Negative.   Skin: Negative.   Neurological: Negative.   Endo/Heme/Allergies: Negative.   Psychiatric/Behavioral: Negative.   All other systems reviewed and are negative.   BP (!) 150/92   Temp 98.6 F (37 C) (Oral)   Ht 5\' 7"  (1.702 m)   Wt 145 lb 9.6 oz (66 kg)   BMI 22.80 kg/m   Physical Exam  Constitutional: She is oriented to person, place,  and time and well-developed, well-nourished, and in no distress. No distress.  HENT:  Head: Normocephalic and atraumatic.  Right Ear: External ear normal.  Left Ear: External ear normal.  Nose: Nose normal.  Mouth/Throat: Oropharynx is clear and moist. No oropharyngeal exudate.  Eyes: Conjunctivae and EOM are normal. Pupils are equal, round, and reactive to light. Right eye exhibits no discharge. Left eye exhibits no discharge. No scleral icterus.  Neck: Normal range of motion. Neck supple. No JVD present. No tracheal deviation present. No thyromegaly present.  Cardiovascular: Normal rate, regular rhythm, normal heart sounds and intact distal pulses.  Exam reveals no gallop and no friction rub.   No murmur heard. Pulmonary/Chest: Effort normal and breath sounds  normal. No stridor. No respiratory distress. She has no wheezes. She has no rales. She exhibits no tenderness.  Musculoskeletal: Normal range of motion. She exhibits no edema, tenderness or deformity.  Lymphadenopathy:    She has no cervical adenopathy.  Neurological: She is alert and oriented to person, place, and time. She displays normal reflexes. No cranial nerve deficit. She exhibits normal muscle tone. Gait normal. Coordination normal. GCS score is 15.  Skin: Skin is warm and dry. No rash noted. She is not diaphoretic. No erythema. No pallor.  Psychiatric: Mood, memory, affect and judgment normal.  Nursing note and vitals reviewed.  Assessment/Plan:  1. Essential hypertension - nebivolol (BYSTOLIC) 10 MG tablet; Take 1 tablet (10 mg total) by mouth daily.  Dispense: 90 tablet; Refill: 3 - Will continue to monitor   2. Encounter to establish care - She will be seen next month for her CPE - Continue to eat healthy and try adding some walking in the week - Follow up as needed  Dorothyann Peng, NP

## 2016-08-14 ENCOUNTER — Other Ambulatory Visit (INDEPENDENT_AMBULATORY_CARE_PROVIDER_SITE_OTHER): Payer: PRIVATE HEALTH INSURANCE

## 2016-08-14 DIAGNOSIS — E559 Vitamin D deficiency, unspecified: Secondary | ICD-10-CM

## 2016-08-14 DIAGNOSIS — Z0000 Encounter for general adult medical examination without abnormal findings: Secondary | ICD-10-CM

## 2016-08-14 LAB — POC URINALSYSI DIPSTICK (AUTOMATED)
BILIRUBIN UA: NEGATIVE
Blood, UA: NEGATIVE
GLUCOSE UA: NEGATIVE
Ketones, UA: NEGATIVE
LEUKOCYTES UA: NEGATIVE
NITRITE UA: NEGATIVE
Spec Grav, UA: 1.02
Urobilinogen, UA: 0.2
pH, UA: 8

## 2016-08-14 LAB — HEPATIC FUNCTION PANEL
ALBUMIN: 3.6 g/dL (ref 3.5–5.2)
ALK PHOS: 92 U/L (ref 39–117)
ALT: 13 U/L (ref 0–35)
AST: 14 U/L (ref 0–37)
Bilirubin, Direct: 0.1 mg/dL (ref 0.0–0.3)
Total Bilirubin: 0.4 mg/dL (ref 0.2–1.2)
Total Protein: 6.8 g/dL (ref 6.0–8.3)

## 2016-08-14 LAB — BASIC METABOLIC PANEL
BUN: 8 mg/dL (ref 6–23)
CALCIUM: 10.2 mg/dL (ref 8.4–10.5)
CO2: 32 meq/L (ref 19–32)
CREATININE: 0.77 mg/dL (ref 0.40–1.20)
Chloride: 109 mEq/L (ref 96–112)
GFR: 97.1 mL/min (ref >60.00)
GLUCOSE: 83 mg/dL (ref 70–99)
Potassium: 4.2 mEq/L (ref 3.5–5.1)
SODIUM: 144 meq/L (ref 135–145)

## 2016-08-14 LAB — CBC WITH DIFFERENTIAL/PLATELET
BASOS ABS: 0 10*3/uL (ref 0.0–0.1)
Basophils Relative: 0.8 % (ref 0.0–3.0)
Eosinophils Absolute: 0.1 10*3/uL (ref 0.0–0.7)
Eosinophils Relative: 2.8 % (ref 0.0–5.0)
HCT: 39.8 % (ref 36.0–46.0)
Hemoglobin: 13.1 g/dL (ref 12.0–15.0)
LYMPHS ABS: 1.5 10*3/uL (ref 0.7–4.0)
Lymphocytes Relative: 40.8 % (ref 12.0–46.0)
MCHC: 32.9 g/dL (ref 30.0–36.0)
MCV: 91 fl (ref 78.0–100.0)
MONO ABS: 0.3 10*3/uL (ref 0.1–1.0)
MONOS PCT: 7.9 % (ref 3.0–12.0)
NEUTROS ABS: 1.8 10*3/uL (ref 1.4–7.7)
NEUTROS PCT: 47.7 % (ref 43.0–77.0)
PLATELETS: 439 10*3/uL — AB (ref 150.0–400.0)
RBC: 4.37 Mil/uL (ref 3.87–5.11)
RDW: 13.5 % (ref 11.5–15.5)
WBC: 3.7 10*3/uL — ABNORMAL LOW (ref 4.0–10.5)

## 2016-08-14 LAB — LIPID PANEL
CHOLESTEROL: 164 mg/dL (ref 0–200)
HDL: 76.9 mg/dL (ref >39.00)
LDL Cholesterol: 78 mg/dL (ref 0–99)
NonHDL: 86.78
Total CHOL/HDL Ratio: 2
Triglycerides: 45 mg/dL (ref 0.0–149.0)
VLDL: 9 mg/dL (ref 0.0–40.0)

## 2016-08-14 LAB — TSH: TSH: 0.87 u[IU]/mL (ref 0.35–4.50)

## 2016-08-14 LAB — VITAMIN D 25 HYDROXY (VIT D DEFICIENCY, FRACTURES): VITD: 16.82 ng/mL — AB (ref 30.00–100.00)

## 2016-08-21 ENCOUNTER — Ambulatory Visit (INDEPENDENT_AMBULATORY_CARE_PROVIDER_SITE_OTHER): Payer: PRIVATE HEALTH INSURANCE | Admitting: Adult Health

## 2016-08-21 ENCOUNTER — Encounter: Payer: Self-pay | Admitting: Adult Health

## 2016-08-21 VITALS — BP 142/90 | Temp 97.9°F | Ht 67.0 in | Wt 145.2 lb

## 2016-08-21 DIAGNOSIS — Z0000 Encounter for general adult medical examination without abnormal findings: Secondary | ICD-10-CM

## 2016-08-21 DIAGNOSIS — I10 Essential (primary) hypertension: Secondary | ICD-10-CM | POA: Diagnosis not present

## 2016-08-21 DIAGNOSIS — Z23 Encounter for immunization: Secondary | ICD-10-CM | POA: Diagnosis not present

## 2016-08-21 DIAGNOSIS — E559 Vitamin D deficiency, unspecified: Secondary | ICD-10-CM | POA: Diagnosis not present

## 2016-08-21 MED ORDER — NEBIVOLOL HCL 20 MG PO TABS: 20.0000 mg | ORAL_TABLET | Freq: Every day | ORAL | 3 refills | Status: DC

## 2016-08-21 MED ORDER — VALSARTAN-HYDROCHLOROTHIAZIDE 320-12.5 MG PO TABS: ORAL_TABLET | ORAL | 1 refills | Status: DC

## 2016-08-21 NOTE — Patient Instructions (Addendum)
It was great seeing you again!  I have sent in your prescriptions for the blood pressure medications. We went up to 20 mg on Bystolic. Let me know if it continues to be elevated.   Your vitamin D was low, it is recommended that you start taking 1000 units of Vitamin D daily.   Follow up with me in one year    Health Maintenance, Female Adopting a healthy lifestyle and getting preventive care can go a long way to promote health and wellness. Talk with your health care provider about what schedule of regular examinations is right for you. This is a good chance for you to check in with your provider about disease prevention and staying healthy. In between checkups, there are plenty of things you can do on your own. Experts have done a lot of research about which lifestyle changes and preventive measures are most likely to keep you healthy. Ask your health care provider for more information. WEIGHT AND DIET  Eat a healthy diet  Be sure to include plenty of vegetables, fruits, low-fat dairy products, and lean protein.  Do not eat a lot of foods high in solid fats, added sugars, or salt.  Get regular exercise. This is one of the most important things you can do for your health.  Most adults should exercise for at least 150 minutes each week. The exercise should increase your heart rate and make you sweat (moderate-intensity exercise).  Most adults should also do strengthening exercises at least twice a week. This is in addition to the moderate-intensity exercise.  Maintain a healthy weight  Body mass index (BMI) is a measurement that can be used to identify possible weight problems. It estimates body fat based on height and weight. Your health care provider can help determine your BMI and help you achieve or maintain a healthy weight.  For females 3 years of age and older:   A BMI below 18.5 is considered underweight.  A BMI of 18.5 to 24.9 is normal.  A BMI of 25 to 29.9 is  considered overweight.  A BMI of 30 and above is considered obese.  Watch levels of cholesterol and blood lipids  You should start having your blood tested for lipids and cholesterol at 64 years of age, then have this test every 5 years.  You may need to have your cholesterol levels checked more often if:  Your lipid or cholesterol levels are high.  You are older than 64 years of age.  You are at high risk for heart disease.  CANCER SCREENING   Lung Cancer  Lung cancer screening is recommended for adults 87-25 years old who are at high risk for lung cancer because of a history of smoking.  A yearly low-dose CT scan of the lungs is recommended for people who:  Currently smoke.  Have quit within the past 15 years.  Have at least a 30-pack-year history of smoking. A pack year is smoking an average of one pack of cigarettes a day for 1 year.  Yearly screening should continue until it has been 15 years since you quit.  Yearly screening should stop if you develop a health problem that would prevent you from having lung cancer treatment.  Breast Cancer  Practice breast self-awareness. This means understanding how your breasts normally appear and feel.  It also means doing regular breast self-exams. Let your health care provider know about any changes, no matter how small.  If you are in your 50s or  65s, you should have a clinical breast exam (CBE) by a health care provider every 1-3 years as part of a regular health exam.  If you are 68 or older, have a CBE every year. Also consider having a breast X-ray (mammogram) every year.  If you have a family history of breast cancer, talk to your health care provider about genetic screening.  If you are at high risk for breast cancer, talk to your health care provider about having an MRI and a mammogram every year.  Breast cancer gene (BRCA) assessment is recommended for women who have family members with BRCA-related cancers.  BRCA-related cancers include:  Breast.  Ovarian.  Tubal.  Peritoneal cancers.  Results of the assessment will determine the need for genetic counseling and BRCA1 and BRCA2 testing. Cervical Cancer Your health care provider may recommend that you be screened regularly for cancer of the pelvic organs (ovaries, uterus, and vagina). This screening involves a pelvic examination, including checking for microscopic changes to the surface of your cervix (Pap test). You may be encouraged to have this screening done every 3 years, beginning at age 15.  For women ages 29-65, health care providers may recommend pelvic exams and Pap testing every 3 years, or they may recommend the Pap and pelvic exam, combined with testing for human papilloma virus (HPV), every 5 years. Some types of HPV increase your risk of cervical cancer. Testing for HPV may also be done on women of any age with unclear Pap test results.  Other health care providers may not recommend any screening for nonpregnant women who are considered low risk for pelvic cancer and who do not have symptoms. Ask your health care provider if a screening pelvic exam is right for you.  If you have had past treatment for cervical cancer or a condition that could lead to cancer, you need Pap tests and screening for cancer for at least 20 years after your treatment. If Pap tests have been discontinued, your risk factors (such as having a new sexual partner) need to be reassessed to determine if screening should resume. Some women have medical problems that increase the chance of getting cervical cancer. In these cases, your health care provider may recommend more frequent screening and Pap tests. Colorectal Cancer  This type of cancer can be detected and often prevented.  Routine colorectal cancer screening usually begins at 64 years of age and continues through 65 years of age.  Your health care provider may recommend screening at an earlier age if you  have risk factors for colon cancer.  Your health care provider may also recommend using home test kits to check for hidden blood in the stool.  A small camera at the end of a tube can be used to examine your colon directly (sigmoidoscopy or colonoscopy). This is done to check for the earliest forms of colorectal cancer.  Routine screening usually begins at age 38.  Direct examination of the colon should be repeated every 5-10 years through 64 years of age. However, you may need to be screened more often if early forms of precancerous polyps or small growths are found. Skin Cancer  Check your skin from head to toe regularly.  Tell your health care provider about any new moles or changes in moles, especially if there is a change in a mole's shape or color.  Also tell your health care provider if you have a mole that is larger than the size of a pencil eraser.  Always  use sunscreen. Apply sunscreen liberally and repeatedly throughout the day.  Protect yourself by wearing long sleeves, pants, a wide-brimmed hat, and sunglasses whenever you are outside. HEART DISEASE, DIABETES, AND HIGH BLOOD PRESSURE   High blood pressure causes heart disease and increases the risk of stroke. High blood pressure is more likely to develop in:  People who have blood pressure in the high end of the normal range (130-139/85-89 mm Hg).  People who are overweight or obese.  People who are African American.  If you are 50-68 years of age, have your blood pressure checked every 3-5 years. If you are 35 years of age or older, have your blood pressure checked every year. You should have your blood pressure measured twice--once when you are at a hospital or clinic, and once when you are not at a hospital or clinic. Record the average of the two measurements. To check your blood pressure when you are not at a hospital or clinic, you can use:  An automated blood pressure machine at a pharmacy.  A home blood pressure  monitor.  If you are between 46 years and 42 years old, ask your health care provider if you should take aspirin to prevent strokes.  Have regular diabetes screenings. This involves taking a blood sample to check your fasting blood sugar level.  If you are at a normal weight and have a low risk for diabetes, have this test once every three years after 64 years of age.  If you are overweight and have a high risk for diabetes, consider being tested at a younger age or more often. PREVENTING INFECTION  Hepatitis B  If you have a higher risk for hepatitis B, you should be screened for this virus. You are considered at high risk for hepatitis B if:  You were born in a country where hepatitis B is common. Ask your health care provider which countries are considered high risk.  Your parents were born in a high-risk country, and you have not been immunized against hepatitis B (hepatitis B vaccine).  You have HIV or AIDS.  You use needles to inject street drugs.  You live with someone who has hepatitis B.  You have had sex with someone who has hepatitis B.  You get hemodialysis treatment.  You take certain medicines for conditions, including cancer, organ transplantation, and autoimmune conditions. Hepatitis C  Blood testing is recommended for:  Everyone born from [birth year] through 1964.  Anyone with known risk factors for hepatitis C. Sexually transmitted infections (STIs)  You should be screened for sexually transmitted infections (STIs) including gonorrhea and chlamydia if:  You are sexually active and are younger than 64 years of age.  You are older than 64 years of age and your health care provider tells you that you are at risk for this type of infection.  Your sexual activity has changed since you were last screened and you are at an increased risk for chlamydia or gonorrhea. Ask your health care provider if you are at risk.  If you do not have HIV, but are at risk, it may be  recommended that you take a prescription medicine daily to prevent HIV infection. This is called pre-exposure prophylaxis (PrEP). You are considered at risk if:  You are sexually active and do not regularly use condoms or know the HIV status of your partner(s).  You take drugs by injection.  You are sexually active with a partner who has HIV. Talk with your health care  provider about whether you are at high risk of being infected with HIV. If you choose to begin PrEP, you should first be tested for HIV. You should then be tested every 3 months for as long as you are taking PrEP.  PREGNANCY   If you are premenopausal and you may become pregnant, ask your health care provider about preconception counseling.  If you may become pregnant, take 400 to 800 micrograms (mcg) of folic acid every day.  If you want to prevent pregnancy, talk to your health care provider about birth control (contraception). OSTEOPOROSIS AND MENOPAUSE   Osteoporosis is a disease in which the bones lose minerals and strength with aging. This can result in serious bone fractures. Your risk for osteoporosis can be identified using a bone density scan.  If you are 59 years of age or older, or if you are at risk for osteoporosis and fractures, ask your health care provider if you should be screened.  Ask your health care provider whether you should take a calcium or vitamin D supplement to lower your risk for osteoporosis.  Menopause may have certain physical symptoms and risks.  Hormone replacement therapy may reduce some of these symptoms and risks. Talk to your health care provider about whether hormone replacement therapy is right for you.  HOME CARE INSTRUCTIONS   Schedule regular health, dental, and eye exams.  Stay current with your immunizations.   Do not use any tobacco products including cigarettes, chewing tobacco, or electronic cigarettes.  If you are pregnant, do not drink alcohol.  If you are  breastfeeding, limit how much and how often you drink alcohol.  Limit alcohol intake to no more than 1 drink per day for nonpregnant women. One drink equals 12 ounces of beer, 5 ounces of wine, or 1 ounces of hard liquor.  Do not use street drugs.  Do not share needles.  Ask your health care provider for help if you need support or information about quitting drugs.  Tell your health care provider if you often feel depressed.  Tell your health care provider if you have ever been abused or do not feel safe at home.   This information is not intended to replace advice given to you by your health care provider. Make sure you discuss any questions you have with your health care provider.   Document Released: 05/11/2011 Document Revised: 11/16/2014 Document Reviewed: 09/27/2013 Elsevier Interactive Patient Education [employer].

## 2016-08-21 NOTE — Progress Notes (Signed)
Subjective:    Patient ID: Shannon [name], female    DOB: 06-16-1951, 65 y.o.   MRN: [id number]  HPI  Patient presents for yearly preventative medicine examination. She is a very pleasant 65 year old female who  has a past medical history of Allergy; GERD (gastroesophageal reflux disease); Headache(784.0); and Hypertension.  All immunizations and health maintenance protocols were reviewed with the patient and needed orders were placed. She had her yearly flu vaccination today   Medication reconciliation,  past medical history, social history, problem list and allergies were reviewed in detail with the patient  Goals were established with regard to weight loss, exercise, and  diet in compliance with medications. She is working on eating healthy and has started swimming again  End of life planning was discussed.She has an advanced directive and living will  She sees her GYN on a regular basis. She is up to date on her colonoscopy, dental and vision exams. She does monthly self breast exams and has not noticed any changes  Over all she reports that she is feeling well and has no acute complaints   Her blood pressure continues to be elevated despite taking Bystolic 10 mg and Diovan 320-12.5mg    Review of Systems  Constitutional: Negative.   HENT: Negative.   Eyes: Negative.   Respiratory: Negative.   Cardiovascular: Negative.   Gastrointestinal: Negative.   Endocrine: Negative.   Genitourinary: Negative.   Musculoskeletal: Negative.   Skin: Negative.   Allergic/Immunologic: Negative.   Neurological: Negative.   Hematological: Negative.   Psychiatric/Behavioral: Negative.    Past Medical History:  Diagnosis Date  . Allergy   . GERD (gastroesophageal reflux disease)   . Headache(784.0)   . Hypertension     Social History   Social History  . Marital status: Married    Spouse name: N/A  . Number of children: N/A  . Years of education: N/A   Occupational History  .  Not on file.   Social History Main Topics  . Smoking status: Never Smoker  . Smokeless tobacco: Not on file  . Alcohol use No  . Drug use:     Types: Marijuana  . Sexual activity: Not on file   Other Topics Concern  . 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 BREAST LUMPECTOMY    . TUBAL LIGATION      Family History  Problem Relation Age of Onset  . Hypertension Mother     age 13  . Alzheimer's disease Father     deceased secondary to alzheimer's disease    Allergies  Allergen Reactions  . Penicillins Shortness Of Breath  . Corn-Containing Products Itching    Current Outpatient Prescriptions on File Prior to Visit  Medication Sig Dispense Refill  . zolpidem (AMBIEN) 5 MG tablet take 1 tablet by mouth at bedtime if needed for sleep 30 tablet 5   No current facility-administered medications on file prior to visit.     BP (!) 142/90   Temp 97.9 F (36.6 C) (Oral)   Ht 5\' 7"  (1.702 m)   Wt 145 lb 4 oz (65.9 kg)   BMI 22.75 kg/m  Objective:   Physical Exam  Constitutional: She is oriented to person, place, and time. She appears well-developed and well-nourished. No distress.  HENT:  Head: Normocephalic and atraumatic.  Right Ear: External ear normal.  Left Ear: External ear normal.  Nose: Nose normal.  Mouth/Throat: Oropharynx is clear and moist. No oropharyngeal exudate.  Eyes: Conjunctivae and EOM are normal. Pupils are equal, round, and reactive to light. Right eye exhibits no discharge. Left eye exhibits no discharge. No scleral icterus.  Neck: Normal range of motion and full passive range of motion without pain. Neck supple. No JVD present. No tracheal tenderness present. Carotid bruit is not present. No tracheal deviation present. No  thyroid mass and no thyromegaly present.  Cardiovascular: Normal rate, regular rhythm, normal heart sounds and intact distal pulses.  Exam reveals no gallop and no friction rub.   No murmur heard. Pulmonary/Chest: Effort normal and breath sounds normal. No stridor. No respiratory distress. She has no wheezes. She has no rales. She exhibits no tenderness.  Abdominal: Soft. Bowel sounds are normal. She exhibits no distension and no mass. There is no tenderness. There is no rebound and no guarding.  Genitourinary:  Genitourinary Comments: Breast Exam: No masses, dimpling, discharge or abnormalities. Refused GYN exam   Musculoskeletal: Normal range of motion. She exhibits no edema, tenderness or deformity.  Lymphadenopathy:    She has no cervical adenopathy.  Neurological: She is alert and oriented to person, place, and time. She has normal reflexes. No cranial nerve deficit. Coordination normal.  Skin: Skin is warm and dry. No rash noted. She is not diaphoretic. No erythema. No pallor.  Psychiatric: She has a normal mood and affect. Her behavior is normal. Judgment and thought content normal.  Nursing note and vitals reviewed.      Assessment & Plan:  1. Routine general medical examination at a health care facility - Reviewed labs in detail with patient and all questions answered - Continue to work on heart healthy diet  - Increase exercise frequency  - Follow up in one year or sooner if needed  2. Essential hypertension - Not at goal  - BP: (!) 142/90 - Will increase Bystolic from 10 mg to 20 mg  - Nebivolol HCl 20 MG TABS; Take 1 tablet (20 mg total) by mouth daily.  Dispense: 90 tablet; Refill: 3 - valsartan-hydrochlorothiazide (DIOVAN-HCT) 320-12.5 MG tablet; take 1 tablet by mouth once daily  Dispense: 90 tablet; Refill: 1  3. Need for immunization against influenza - Flu Vaccine QUAD 36+ mos IM  4. Vitamin D deficiency - Vitamin D was low.  - 1000 units daily  - Will recheck  in one year   Dorothyann Peng, NP

## 2016-08-21 NOTE — Progress Notes (Signed)
Pre visit review using our clinic review tool, if applicable. No additional management support is needed unless otherwise documented below in the visit note. 

## 2017-02-25 ENCOUNTER — Encounter: Payer: Self-pay | Admitting: Gynecology

## 2017-02-25 ENCOUNTER — Ambulatory Visit (INDEPENDENT_AMBULATORY_CARE_PROVIDER_SITE_OTHER): Payer: Commercial Managed Care - PPO | Admitting: Gynecology

## 2017-02-25 VITALS — BP 134/86 | Ht 64.75 in | Wt 141.4 lb

## 2017-02-25 DIAGNOSIS — E559 Vitamin D deficiency, unspecified: Secondary | ICD-10-CM

## 2017-02-25 DIAGNOSIS — Z780 Asymptomatic menopausal state: Secondary | ICD-10-CM | POA: Diagnosis not present

## 2017-02-25 DIAGNOSIS — Z01419 Encounter for gynecological examination (general) (routine) without abnormal findings: Secondary | ICD-10-CM

## 2017-02-25 NOTE — Progress Notes (Signed)
Shannon [name] February 05, 1951 [id number]   History:    66 y.o.  for annual gyn exam who is not been seen the office in 2015 is asymptomatic. Patient's PCP is Dr. Shawna Orleans who has been treating the patient for hypertension and has been doing her blood work. Patient with no previous abnormal Pap smears. Her bone density in 2011 was normal. 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 It was noted on her past medical records that she had a fibroid uterus back in 2012 and she's had no problems. No vaginal bleeding reported. She recent menopause at the age of 35 and has never been on hormone replacement therapy. All her vaccines are up to date except the shingles are seen. She occasionally suffer from vaginal dryness but uses topical lubricants during intercourse.  Patient with history in 1978 and having had a left breast cyst removed which was benign.   Past medical history,surgical history, family history and social history were all reviewed and documented in the EPIC chart.  Gynecologic History No LMP recorded. Patient is postmenopausal. Contraception: post menopausal status Last Pap: 2015. Results were: normal Last mammogram: 2013. Results were: normal  Obstetric History OB History  Gravida Para Term Preterm AB Living  3 2     1 2   SAB TAB Ectopic Multiple Live Births               # Outcome Date GA Lbr Len/2nd Weight Sex Delivery Anes PTL Lv  3 AB           2 Para           1 Para                ROS: A ROS was performed and pertinent positives and negatives are included in the history.  GENERAL: No fevers or chills. HEENT: No change in vision, no earache, sore throat or sinus congestion. NECK: No pain or stiffness. CARDIOVASCULAR: No chest pain or pressure. No palpitations. PULMONARY: No shortness of breath, cough or wheeze. GASTROINTESTINAL: No abdominal pain, nausea, vomiting or  diarrhea, melena or bright red blood per rectum. GENITOURINARY: No urinary frequency, urgency, hesitancy or dysuria. MUSCULOSKELETAL: No joint or muscle pain, no back pain, no recent trauma. DERMATOLOGIC: No rash, no itching, no lesions. ENDOCRINE: No polyuria, polydipsia, no heat or cold intolerance. No recent change in weight. HEMATOLOGICAL: No anemia or easy bruising or bleeding. NEUROLOGIC: No headache, seizures, numbness, tingling or weakness. PSYCHIATRIC: No depression, no loss of interest in normal activity or change in sleep pattern.     Exam: chaperone present  BP 134/86   Ht 5' 4.75" (1.645 m)   Wt 141 lb 6.4 oz (64.1 kg)   BMI 23.71 kg/m   Body mass index is 23.71 kg/m.  General appearance : Well developed well nourished female. No acute distress HEENT: Eyes: no retinal hemorrhage or exudates,  Neck supple, trachea midline, no carotid bruits, no thyroidmegaly Lungs: Clear to auscultation, no rhonchi or wheezes, or rib retractions  Heart: Regular rate and rhythm, no murmurs or gallops Breast:Examined in sitting and supine position were symmetrical in appearance, no palpable masses or tenderness,  no skin retraction, no nipple inversion, no nipple discharge, no skin discoloration, no axillary or supraclavicular lymphadenopathy Abdomen: no palpable masses or tenderness, no rebound or guarding Extremities: no edema or skin discoloration or tenderness  Pelvic:  Bartholin, Urethra, Skene Glands: Within normal limits             Vagina: No gross lesions or discharge, atrophic changes  Cervix: No gross lesions or discharge  Uterus  anteverted, normal size, shape and consistency, non-tender and mobile  Adnexa  Without masses or tenderness  Anus and perineum  normal   Rectovaginal  normal sphincter tone without palpated masses or tenderness             Hemoccult will have colonoscopy this year     Assessment/Plan:  66 y.o. female for annual exam will return back to the office for  her overdue bone density study. I've given the name of the gastroenterologist for her to follow-up since she is overdue for her colonoscopy. I also have provided with a requisition to schedule her overdue mammogram. Because of her history vitamin D deficiency a vitamin D level be drawn today. Pap smear with HPV screening done today. Her PCP has done the remainder her blood work and her vaccines are up-to-date. We discussed importance of calcium vitamin D and weightbearing exercises for osteoporosis prevention.   Terrance Mass MD, 2:54 PM 02/25/2017

## 2017-02-25 NOTE — Patient Instructions (Signed)
Bone Densitometry Bone densitometry is an imaging test that uses a special X-ray to measure the amount of calcium and other minerals in your bones (bone density). This test is also known as a bone mineral density test or dual-energy X-ray absorptiometry (DXA). The test can measure bone density at your hip and your spine. It is similar to having a regular X-ray. You may have this test to:  Diagnose a condition that causes weak or thin bones (osteoporosis).  Predict your risk of a broken bone (fracture).  Determine how well osteoporosis treatment is working. Tell a health care provider about:  Any allergies you have.  All medicines you are taking, including vitamins, herbs, eye drops, creams, and over-the-counter medicines.  Any problems you or family members have had with anesthetic medicines.  Any blood disorders you have.  Any surgeries you have had.  Any medical conditions you have.  Possibility of pregnancy.  Any other medical test you had within the previous 14 days that used contrast material. What are the risks? Generally, this is a safe procedure. However, problems can occur and may include the following:  This test exposes you to a very small amount of radiation.  The risks of radiation exposure may be greater to unborn children. What happens before the procedure?  Do not take any calcium supplements for 24 hours before having the test. You can otherwise eat and drink what you usually do.  Take off all metal jewelry, eyeglasses, dental appliances, and any other metal objects. What happens during the procedure?  You may lie on an exam table. There will be an X-ray generator below you and an imaging device above you.  Other devices, such as boxes or braces, may be used to position your body properly for the scan.  You will need to lie still while the machine slowly scans your body.  The images will show up on a computer monitor. What happens after the  procedure? You may need more testing at a later time. This information is not intended to replace advice given to you by your health care provider. Make sure you discuss any questions you have with your health care provider. Document Released: 11/17/2004 Document Revised: 04/02/2016 Document Reviewed: 04/05/2014 Elsevier Interactive Patient Education  2017 Elsevier Inc[employer]  

## 2017-02-26 LAB — VITAMIN D 25 HYDROXY (VIT D DEFICIENCY, FRACTURES): Vit D, 25-Hydroxy: 33 ng/mL (ref 30–100)

## 2017-03-01 LAB — PAP, TP IMAGING W/ HPV RNA, RFLX HPV TYPE 16,18/45: HPV MRNA, HIGH RISK: NOT DETECTED

## 2017-03-18 ENCOUNTER — Other Ambulatory Visit: Payer: Self-pay | Admitting: Adult Health

## 2017-03-18 DIAGNOSIS — I10 Essential (primary) hypertension: Secondary | ICD-10-CM

## 2017-03-18 NOTE — Telephone Encounter (Signed)
Ok to refill for one year  

## 2017-03-24 ENCOUNTER — Encounter: Payer: Self-pay | Admitting: Gynecology

## 2017-03-25 ENCOUNTER — Other Ambulatory Visit: Payer: Self-pay | Admitting: Gynecology

## 2017-03-25 ENCOUNTER — Ambulatory Visit (INDEPENDENT_AMBULATORY_CARE_PROVIDER_SITE_OTHER): Payer: Commercial Managed Care - PPO

## 2017-03-25 ENCOUNTER — Encounter: Payer: Self-pay | Admitting: Gynecology

## 2017-03-25 DIAGNOSIS — E559 Vitamin D deficiency, unspecified: Secondary | ICD-10-CM

## 2017-03-25 DIAGNOSIS — Z13820 Encounter for screening for osteoporosis: Secondary | ICD-10-CM

## 2017-03-25 DIAGNOSIS — Z780 Asymptomatic menopausal state: Secondary | ICD-10-CM | POA: Diagnosis not present

## 2017-04-16 ENCOUNTER — Encounter: Payer: Self-pay | Admitting: Gynecology

## 2017-05-27 ENCOUNTER — Encounter: Payer: Self-pay | Admitting: Adult Health

## 2017-05-27 ENCOUNTER — Ambulatory Visit (INDEPENDENT_AMBULATORY_CARE_PROVIDER_SITE_OTHER): Payer: Commercial Managed Care - PPO | Admitting: Adult Health

## 2017-05-27 ENCOUNTER — Ambulatory Visit (HOSPITAL_COMMUNITY)
Admission: RE | Admit: 2017-05-27 | Discharge: 2017-05-27 | Disposition: A | Discharge status: Home / Self Care | Payer: Commercial Managed Care - PPO | Source: Ambulatory Visit | Attending: Adult Health | Admitting: Adult Health

## 2017-05-27 VITALS — BP 110/80 | HR 85 | Temp 98.3°F | Wt 136.9 lb

## 2017-05-27 DIAGNOSIS — R634 Abnormal weight loss: Secondary | ICD-10-CM | POA: Diagnosis not present

## 2017-05-27 DIAGNOSIS — G4700 Insomnia, unspecified: Secondary | ICD-10-CM | POA: Diagnosis not present

## 2017-05-27 DIAGNOSIS — R252 Cramp and spasm: Secondary | ICD-10-CM | POA: Diagnosis not present

## 2017-05-27 DIAGNOSIS — R630 Anorexia: Secondary | ICD-10-CM | POA: Diagnosis present

## 2017-05-27 MED ORDER — ZOLPIDEM TARTRATE 5 MG PO TABS: 5.0000 mg | ORAL_TABLET | Freq: Every day | ORAL | 0 refills | Status: DC

## 2017-05-27 NOTE — Progress Notes (Signed)
Subjective:    Patient ID: Shannon [name], female    DOB: 01/17/1951, 66 y.o.   MRN: [id number]  HPI   66 year old female who  has a past medical history of Allergy; GERD (gastroesophageal reflux disease); Headache(784.0); Hypertension; and Osteopenia.  She presents to the office today with the complaint of poor appetite and weight loss. Her symptoms started 2-3  months ago. She reports that she will have 2-3 bites of each meal and will feel full. 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   She reports leg cramps that started sometime this week. Legs cramps are worse at night and present from right below knee to shin on bilateral legs. She has recently started taking Benadryl because she was out of Ambien. Reports staying well hydrated.   She denies any n/v/d/ fevers, or feeling acutely ill.   Wt Readings from Last 3 Encounters:  05/27/17 136 lb 14.4 oz (62.1 kg)  02/25/17 141 lb 6.4 oz (64.1 kg)  08/21/16 145 lb 4 oz (65.9 kg)     Review of Systems  Constitutional: Positive for appetite change and unexpected weight change.  HENT: Negative.   Eyes: Negative.   Respiratory: Negative.   Cardiovascular: Negative.   Gastrointestinal: Negative.   Endocrine: Negative.   Genitourinary: Negative.   Musculoskeletal: Positive for myalgias.  Skin: Negative.   Neurological: Negative.   Hematological: Negative.   All other systems reviewed and are negative.  Past Medical History:  Diagnosis Date  . Allergy   . GERD (gastroesophageal reflux disease)   . Headache(784.0)   . Hypertension   . Osteopenia     Social History   Social History  . Marital status: Married    Spouse name: N/A  . Number of children: N/A  . Years of education: N/A   Occupational History  . Not on file.   Social History Main Topics  . Smoking status: Current Some Day Smoker  . Smokeless tobacco: Never Used  . Alcohol use 3.0 oz/week    5 Glasses of wine  per week  . Drug use: Yes    Types: Marijuana  . Sexual activity: Yes   Other Topics Concern  . 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 BREAST LUMPECTOMY    . TUBAL LIGATION      Family History  Problem Relation Age of Onset  . Hypertension Mother        age 19  . Alzheimer's disease Father        deceased secondary to alzheimer's disease    Allergies  Allergen Reactions  . Penicillins Shortness Of Breath  . Corn-Containing Products Itching    Current Outpatient Prescriptions on File Prior to Visit  Medication Sig Dispense Refill  . Ascorbic Acid (VITAMIN C) 1000 MG tablet Take 1,000 mg by mouth daily.    . Nebivolol HCl 20 MG TABS Take 1 tablet (20 mg total) by mouth daily. 90 tablet 3  . valsartan-hydrochlorothiazide (DIOVAN-HCT) 320-12.5 MG tablet take 1 tablet by mouth once daily 90 tablet 3   No current facility-administered medications on file  prior to visit.     BP 110/80 (BP Location: Left Arm, Patient Position: Sitting, Cuff Size: Normal)   Pulse 85   Temp 98.3 F (36.8 C) (Oral)   Wt 136 lb 14.4 oz (62.1 kg)   SpO2 97%   BMI 22.96 kg/m       Objective:   Physical Exam  Constitutional: She is oriented to person, place, and time. She appears well-developed and well-nourished. No distress.  HENT:  Head: Normocephalic and atraumatic.  Right Ear: External ear normal.  Left Ear: External ear normal.  Nose: Nose normal.  Mouth/Throat: Oropharynx is clear and moist. No oropharyngeal exudate.  Eyes: Pupils are equal, round, and reactive to light. Conjunctivae and EOM are normal. Right eye exhibits no discharge. Left eye exhibits no discharge. No scleral icterus.  Neck: Normal range of motion. Neck supple. No thyromegaly present.    Cardiovascular: Normal rate, regular rhythm, normal heart sounds and intact distal pulses.  Exam reveals no gallop and no friction rub.   No murmur heard. Pulmonary/Chest: Effort normal and breath sounds normal. No respiratory distress. She has no wheezes. She has no rales. She exhibits no tenderness.  Abdominal: Soft. Bowel sounds are normal. She exhibits no distension and no mass. There is no tenderness. There is no rebound and no guarding.  Musculoskeletal: Normal range of motion. She exhibits no edema, tenderness or deformity.  Neurological: She is alert and oriented to person, place, and time.  Skin: Skin is warm and dry. No rash noted. She is not diaphoretic. No erythema. No pallor.  Psychiatric: She has a normal mood and affect. Her behavior is normal. Judgment and thought content normal.  Nursing note and vitals reviewed.     Assessment & Plan:  1. Weight loss, unintentional - She has consistently averaged weight in the 140's.  - It sounds like she has a lot of stress at work and has started to work on her diet at home. Advised high quality proteins - Will continue to monitor or treat depending on labs and chest x ray   Wt Readings from Last 3 Encounters:  05/27/17 136 lb 14.4 oz (62.1 kg)  02/25/17 141 lb 6.4 oz (64.1 kg)  08/21/16 145 lb 4 oz (65.9 kg)    - Basic metabolic panel - CBC with Differential/Platelet - Hepatic function panel - TSH - Magnesium - Vitamin D, 25-hydroxy - DG Chest 2 View; Future  2. Leg cramps - Possibly due to decreased PO intake or benadryl. Will have her stop benadryl  - Magnesium - Vitamin D, 25-hydroxy  3. Decreased appetite  - Basic metabolic panel - CBC with Differential/Platelet - Hepatic function panel - TSH - Magnesium - Vitamin D, 25-hydroxy - DG Chest 2 View; Future  4. Insomnia, unspecified type  - zolpidem (AMBIEN) 5 MG tablet; Take 1 tablet (5 mg total) by mouth at bedtime.  Dispense: 30 tablet; Refill: 0  Dorothyann Peng, NP

## 2017-05-28 LAB — CBC WITH DIFFERENTIAL/PLATELET
BASOS ABS: 0.1 10*3/uL (ref 0.0–0.1)
Basophils Relative: 0.9 % (ref 0.0–3.0)
EOS ABS: 0 10*3/uL (ref 0.0–0.7)
Eosinophils Relative: 0.8 % (ref 0.0–5.0)
HEMATOCRIT: 41.7 % (ref 36.0–46.0)
HEMOGLOBIN: 13.6 g/dL (ref 12.0–15.0)
LYMPHS PCT: 43.3 % (ref 12.0–46.0)
Lymphs Abs: 2.4 10*3/uL (ref 0.7–4.0)
MCHC: 32.8 g/dL (ref 30.0–36.0)
MCV: 95.4 fl (ref 78.0–100.0)
Monocytes Absolute: 0.5 10*3/uL (ref 0.1–1.0)
Monocytes Relative: 8.8 % (ref 3.0–12.0)
Neutro Abs: 2.6 10*3/uL (ref 1.4–7.7)
Neutrophils Relative %: 46.2 % (ref 43.0–77.0)
Platelets: 424 10*3/uL — ABNORMAL HIGH (ref 150.0–400.0)
RBC: 4.37 Mil/uL (ref 3.87–5.11)
RDW: 13.3 % (ref 11.5–15.5)
WBC: 5.6 10*3/uL (ref 4.0–10.5)

## 2017-05-28 LAB — BASIC METABOLIC PANEL
BUN: 14 mg/dL (ref 6–23)
CALCIUM: 10.9 mg/dL — AB (ref 8.4–10.5)
CHLORIDE: 101 meq/L (ref 96–112)
CO2: 32 mEq/L (ref 19–32)
CREATININE: 0.78 mg/dL (ref 0.40–1.20)
GFR: 95.43 mL/min (ref >60.00)
Glucose, Bld: 89 mg/dL (ref 70–99)
Potassium: 4 mEq/L (ref 3.5–5.1)
Sodium: 139 mEq/L (ref 135–145)

## 2017-05-28 LAB — HEPATIC FUNCTION PANEL
ALT: 16 U/L (ref 0–35)
AST: 19 U/L (ref 0–37)
Albumin: 4.2 g/dL (ref 3.5–5.2)
Alkaline Phosphatase: 85 U/L (ref 39–117)
BILIRUBIN DIRECT: 0.1 mg/dL (ref 0.0–0.3)
BILIRUBIN TOTAL: 0.6 mg/dL (ref 0.2–1.2)
Total Protein: 6.7 g/dL (ref 6.0–8.3)

## 2017-05-28 LAB — MAGNESIUM: MAGNESIUM: 2 mg/dL (ref 1.5–2.5)

## 2017-05-28 LAB — TSH: TSH: 0.79 u[IU]/mL (ref 0.35–4.50)

## 2017-05-28 LAB — VITAMIN D 25 HYDROXY (VIT D DEFICIENCY, FRACTURES): VITD: 32.26 ng/mL (ref 30.00–100.00)

## 2017-06-11 ENCOUNTER — Telehealth: Payer: Self-pay | Admitting: Adult Health

## 2017-06-11 MED ORDER — LOSARTAN POTASSIUM-HCTZ 100-25 MG PO TABS: 1.0000 | ORAL_TABLET | Freq: Every day | ORAL | 1 refills | Status: DC

## 2017-06-11 NOTE — Telephone Encounter (Signed)
Patient informed this Probation officer that her Valsartan had been recalled  Will switch her over to Hyzaar 100-25 mg

## 2017-06-14 ENCOUNTER — Telehealth: Payer: Self-pay | Admitting: Adult Health

## 2017-06-14 NOTE — Telephone Encounter (Signed)
Pt states she is returninh a call about the recall on the  valsartan-hydrochlorothiazide (DIOVAN-HCT) 320-12.5 MG tablet  Wants tio know if OK to take?

## 2017-06-15 ENCOUNTER — Other Ambulatory Visit: Payer: Self-pay | Admitting: Adult Health

## 2017-06-15 NOTE — Telephone Encounter (Signed)
Pt aware new Rx sent to pharmacy °

## 2017-06-15 NOTE — Telephone Encounter (Signed)
Do you want to change this prescription?

## 2017-06-15 NOTE — Telephone Encounter (Signed)
I have sent in Hyzaar for her

## 2017-06-15 NOTE — Telephone Encounter (Signed)
Left a message for a return call.

## 2017-07-02 ENCOUNTER — Other Ambulatory Visit: Payer: Self-pay | Admitting: Family Medicine

## 2017-07-02 DIAGNOSIS — G4700 Insomnia, unspecified: Secondary | ICD-10-CM

## 2017-07-06 NOTE — Telephone Encounter (Signed)
Ok to refill for 30 days  

## 2017-07-06 NOTE — Telephone Encounter (Signed)
Called to the pharmacy and left on machine. 

## 2017-07-22 ENCOUNTER — Encounter: Payer: Self-pay | Admitting: Obstetrics & Gynecology

## 2017-07-22 ENCOUNTER — Ambulatory Visit (INDEPENDENT_AMBULATORY_CARE_PROVIDER_SITE_OTHER): Payer: Commercial Managed Care - PPO | Admitting: Obstetrics & Gynecology

## 2017-07-22 VITALS — BP 118/72

## 2017-07-22 DIAGNOSIS — L723 Sebaceous cyst: Secondary | ICD-10-CM | POA: Diagnosis not present

## 2017-07-22 NOTE — Progress Notes (Signed)
    Shannon [name] 06/19/1951 [id number]        66 y.o.  [id number]   RP:  Small bumps Rt post vulva  HPI:  Noticed 2 small bumps at Rt post vulva x about 2 weeks.  Not painful, no drainage, a little itchy.  No vaginal d/c.  No pelvic Pain.  Declines STI screen.  Past medical history,surgical history, problem list, medications, allergies, family history and social history were all reviewed and documented in the EPIC chart.  Directed ROS with pertinent positives and negatives documented in the history of present illness/assessment and plan.  Exam:  Vitals:   07/22/17 1526  BP: 118/72   General appearance:  Normal  Gyn exam:  Vulva normal except for a small sebaceous gland cyst Rt post Vulva.  Assessment/Plan:  66 y.o. [id number]   1. Sebaceous cyst Rt Vulvar Sebaceous gland cyst.  Warm compresses or soaking in bath recommended.  Reassured.  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  Counseling on above issues >50% x 15 minutes  Princess Bruins MD, 3:36 PM 07/22/2017

## 2017-07-22 NOTE — Patient Instructions (Signed)
1. Sebaceous cyst Rt Vulvar Sebaceous gland cyst.  Warm compresses or soaking in bath recommended.  Reassured.  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  [name], it was a pleasure to meet you today!  I will see you back at your Annual/Gyn visit 02/2018.

## 2017-07-30 ENCOUNTER — Encounter: Payer: Self-pay | Admitting: Adult Health

## 2017-08-19 ENCOUNTER — Other Ambulatory Visit: Payer: Self-pay | Admitting: Adult Health

## 2017-08-19 DIAGNOSIS — G4700 Insomnia, unspecified: Secondary | ICD-10-CM

## 2017-08-19 DIAGNOSIS — I10 Essential (primary) hypertension: Secondary | ICD-10-CM

## 2017-08-19 NOTE — Telephone Encounter (Signed)
Ambien last filled on 07/06/17 #30 Last seen on 06/14/17 No future appt scheduled Please advise.  Thanks!!

## 2017-08-19 NOTE — Telephone Encounter (Signed)
Ok to refill Bystolic for one year and [place] for 30 days

## 2017-08-19 NOTE — Telephone Encounter (Signed)
Called to the pharmacy and left on machine. 

## 2017-08-20 ENCOUNTER — Other Ambulatory Visit: Payer: Self-pay | Admitting: Adult Health

## 2017-08-20 DIAGNOSIS — G4700 Insomnia, unspecified: Secondary | ICD-10-CM

## 2017-08-24 NOTE — Telephone Encounter (Signed)
DENIED.  THIS MEDICATION WAS CALLED IN ON 08/19/2017.

## 2017-09-18 ENCOUNTER — Other Ambulatory Visit: Payer: Self-pay | Admitting: Adult Health

## 2017-09-18 DIAGNOSIS — G4700 Insomnia, unspecified: Secondary | ICD-10-CM

## 2017-09-21 NOTE — Telephone Encounter (Signed)
Called to the pharmacy and left on machine. 

## 2017-09-21 NOTE — Telephone Encounter (Signed)
Ok to refill for 30 days  

## 2017-11-17 ENCOUNTER — Other Ambulatory Visit: Payer: Self-pay | Admitting: Family Medicine

## 2017-11-17 DIAGNOSIS — G4700 Insomnia, unspecified: Secondary | ICD-10-CM

## 2017-11-17 NOTE — Telephone Encounter (Signed)
Refill #15 until Tommi Rumps returns.

## 2017-11-17 NOTE — Telephone Encounter (Signed)
Last filled on 09/21/17 for #30. Please advise.  Thanks!!

## 2017-11-18 MED ORDER — ZOLPIDEM TARTRATE 5 MG PO TABS: ORAL_TABLET | ORAL | 0 refills | Status: DC

## 2017-11-18 NOTE — Telephone Encounter (Signed)
Called to the pharmacy and left on machine. 

## 2017-11-25 ENCOUNTER — Ambulatory Visit (INDEPENDENT_AMBULATORY_CARE_PROVIDER_SITE_OTHER): Payer: Commercial Managed Care - PPO | Admitting: Adult Health

## 2017-11-25 ENCOUNTER — Encounter: Payer: Self-pay | Admitting: Adult Health

## 2017-11-25 VITALS — BP 124/60 | Temp 98.4°F | Ht 65.0 in | Wt 136.0 lb

## 2017-11-25 DIAGNOSIS — I10 Essential (primary) hypertension: Secondary | ICD-10-CM | POA: Diagnosis not present

## 2017-11-25 DIAGNOSIS — Z0000 Encounter for general adult medical examination without abnormal findings: Secondary | ICD-10-CM | POA: Diagnosis not present

## 2017-11-25 DIAGNOSIS — E559 Vitamin D deficiency, unspecified: Secondary | ICD-10-CM

## 2017-11-25 DIAGNOSIS — Z23 Encounter for immunization: Secondary | ICD-10-CM | POA: Diagnosis not present

## 2017-11-25 DIAGNOSIS — Z1211 Encounter for screening for malignant neoplasm of colon: Secondary | ICD-10-CM

## 2017-11-25 DIAGNOSIS — Z1159 Encounter for screening for other viral diseases: Secondary | ICD-10-CM | POA: Diagnosis not present

## 2017-11-25 DIAGNOSIS — G4700 Insomnia, unspecified: Secondary | ICD-10-CM

## 2017-11-25 MED ORDER — LOSARTAN POTASSIUM-HCTZ 100-25 MG PO TABS: 1.0000 | ORAL_TABLET | Freq: Every day | ORAL | 3 refills | Status: DC

## 2017-11-25 MED ORDER — NEBIVOLOL HCL 20 MG PO TABS: 1.0000 | ORAL_TABLET | Freq: Every day | ORAL | 3 refills | Status: DC

## 2017-11-25 MED ORDER — ZOLPIDEM TARTRATE 5 MG PO TABS: ORAL_TABLET | ORAL | 2 refills | Status: DC

## 2017-11-25 MED ORDER — METHYLPREDNISOLONE 4 MG PO TBPK: ORAL_TABLET | ORAL | 0 refills | Status: DC

## 2017-11-25 NOTE — Progress Notes (Signed)
Subjective:    Patient ID: Shannon [name], female    DOB: 08/01/1951, 67 y.o.   MRN: [id number]  HPI  Patient presents for yearly preventative medicine examination. She is a pleasant 67 year old female who  has a past medical history of Allergy, GERD (gastroesophageal reflux disease), Headache(784.0), Hypertension, and Osteopenia.  She takes Hyzaar 409-81 mg and bystolic 20 mg  for hypertension  She takes Vitamin D for Deficiency   She uses Ambien 5 mg for insomnia   All immunizations and health maintenance protocols were reviewed with the patient and needed orders were placed.  Appropriate screening laboratory values were ordered for the patient including screening of hyperlipidemia, renal function and hepatic function.  Medication reconciliation,  past medical history, social history, problem list and allergies were reviewed in detail with the patient  Goals were established with regard to weight loss, exercise, and  diet in compliance with medications. She has not been exercising as much as she has in the past. She plans to start swimming again. She tries to eat healthy.   End of life planning was discussed. She has an advanced directive and living will.   She is up to date on mammogram and pap. She does home breast exams and has not noticed anything abnormal. She is due for a colonoscopy   Her only acute complaint is that of intermittent numbness and tingling that radiates down her left arm. This has been present for 1-2 weeks. She denies any issues with grip strength. Feels as though it happens more often throughout the day. Denies any trauma    Review of Systems  Constitutional: Negative.   HENT: Negative.   Eyes: Negative.   Respiratory: Negative.   Cardiovascular: Negative.   Gastrointestinal: Negative.   Endocrine: Negative.   Musculoskeletal: Negative.   Allergic/Immunologic: Negative.   Neurological: Positive for numbness.  Hematological: Negative.     Psychiatric/Behavioral: Negative.    Past Medical History:  Diagnosis Date  . Allergy   . GERD (gastroesophageal reflux disease)   . Headache(784.0)   . Hypertension   . Osteopenia     Social History   Socioeconomic History  . Marital status: Married    Spouse name: Not on file  . Number of children: Not on file  . Years of education: Not on file  . Highest education level: Not on file  Social Needs  . Financial resource strain: Not on file  . Food insecurity - worry: Not on file  . Food insecurity - inability: Not on file  . Transportation needs - medical: Not on file  . Transportation needs - non-medical: Not on file  Occupational History  . Not on file  Tobacco Use  . Smoking status: Current Some Day Smoker  . Smokeless tobacco: Never Used  Substance and Sexual Activity  . Alcohol use: Yes    Alcohol/week: 3.0 oz    Types: 5 Glasses of wine per week  . Drug use: Yes    Types: Marijuana  . Sexual activity: Yes  Other Topics Concern  . 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 BREAST LUMPECTOMY    . TUBAL LIGATION  Family History  Problem Relation Age of Onset  . Hypertension Mother        age 68  . Alzheimer's disease Father        deceased secondary to alzheimer's disease    Allergies  Allergen Reactions  . Penicillins Shortness Of Breath  . Corn-Containing Products Itching    Current Outpatient Medications on File Prior to Visit  Medication Sig Dispense Refill  . Ascorbic Acid (VITAMIN C) 1000 MG tablet Take 1,000 mg by mouth daily.    Marland Kitchen BYSTOLIC 20 MG TABS take 1 tablet by mouth once daily 90 tablet 3  . losartan-hydrochlorothiazide (HYZAAR) 100-25 MG tablet Take 1 tablet by mouth daily. 90 tablet 1  . vitamin E 1000 UNIT capsule  Take 1,000 Units by mouth daily.    Marland Kitchen zolpidem (AMBIEN) 5 MG tablet take 1 tablet by mouth at bedtime if needed 15 tablet 0   No current facility-administered medications on file prior to visit.     BP 124/60 (BP Location: Left Arm)   Temp 98.4 F (36.9 C) (Oral)   Ht 5\' 5"  (1.651 m)   Wt 136 lb (61.7 kg)   BMI 22.63 kg/m       Objective:   Physical Exam  Constitutional: She is oriented to person, place, and time. She appears well-developed and well-nourished. No distress.  HENT:  Head: Normocephalic and atraumatic.  Right Ear: External ear normal.  Left Ear: External ear normal.  Nose: Nose normal.  Mouth/Throat: Oropharynx is clear and moist. No oropharyngeal exudate.  Eyes: Conjunctivae and EOM are normal. Pupils are equal, round, and reactive to light. Right eye exhibits no discharge. Left eye exhibits no discharge. No scleral icterus.  Neck: Trachea normal and normal range of motion. Neck supple. No JVD present. Carotid bruit is not present. No tracheal deviation present. No thyroid mass and no thyromegaly present.  Cardiovascular: Normal rate, regular rhythm, normal heart sounds and intact distal pulses. Exam reveals no gallop and no friction rub.  No murmur heard. Pulmonary/Chest: Effort normal and breath sounds normal. No stridor. No respiratory distress. She has no wheezes. She has no rales. She exhibits no tenderness.  Abdominal: Soft. Bowel sounds are normal. She exhibits no distension and no mass. There is no tenderness. There is no rebound and no guarding.  Genitourinary:  Genitourinary Comments: Breast Exam: Fibrotic breast tissue. No dimpling or discharge noted.   Musculoskeletal: Normal range of motion. She exhibits no edema, tenderness or deformity.  Lymphadenopathy:    She has no cervical adenopathy.  Neurological: She is alert and oriented to person, place, and time. She has normal reflexes. She displays normal reflexes. No cranial nerve deficit. She exhibits  normal muscle tone. Coordination normal.  Skin: Skin is warm and dry. No rash noted. She is not diaphoretic. No erythema. No pallor.  Psychiatric: She has a normal mood and affect. Her behavior is normal. Judgment and thought content normal.  Nursing note and vitals reviewed.     Assessment & Plan:  1. Routine general medical examination at a health care facility - Encouraged more frequent exercise and heart healthy diet  - Follow up in one year or sooner if needed - Ambulatory referral to Gastroenterology; Future - Basic metabolic panel; Future - CBC with Differential/Platelet; Future - Hepatic function panel; Future - Lipid panel; Future - TSH; Future - Vitamin D, 25-hydroxy; Future  2. Need for vaccination with 13-polyvalent pneumococcal conjugate vaccine  - Pneumococcal conjugate vaccine 13-valent  3. Insomnia,  unspecified type  - zolpidem (AMBIEN) 5 MG tablet; take 1 tablet by mouth at bedtime if needed  Dispense: 30 tablet; Refill: 2  4. Essential hypertension - well controlled. No change in medications - losartan-hydrochlorothiazide (HYZAAR) 100-25 MG tablet; Take 1 tablet by mouth daily.  Dispense: 90 tablet; Refill: 3 - Nebivolol HCl (BYSTOLIC) 20 MG TABS; Take 1 tablet (20 mg total) by mouth daily.  Dispense: 90 tablet; Refill: 3 - Ambulatory referral to Gastroenterology; Future - Basic metabolic panel; Future - CBC with Differential/Platelet; Future - Hepatic function panel; Future - Lipid panel; Future - TSH; Future - Vitamin D, 25-hydroxy; Future  5. Vitamin D deficiency  - Vitamin D, 25-hydroxy; Future  6. Need for hepatitis C screening test  - Hep C Antibody; Future  7. Colon cancer screening  - Ambulatory referral to Gastroenterology; Future  Dorothyann Peng, NP

## 2017-12-10 ENCOUNTER — Ambulatory Visit: Payer: Commercial Managed Care - PPO | Admitting: Adult Health

## 2017-12-27 ENCOUNTER — Encounter: Payer: Self-pay | Admitting: Gastroenterology

## 2018-01-27 ENCOUNTER — Ambulatory Visit: Payer: Commercial Managed Care - PPO | Admitting: Obstetrics & Gynecology

## 2018-02-01 ENCOUNTER — Ambulatory Visit: Payer: Commercial Managed Care - PPO | Admitting: Obstetrics & Gynecology

## 2018-02-01 ENCOUNTER — Encounter: Payer: Self-pay | Admitting: Obstetrics & Gynecology

## 2018-02-01 VITALS — BP 128/74

## 2018-02-01 DIAGNOSIS — Z113 Encounter for screening for infections with a predominantly sexual mode of transmission: Secondary | ICD-10-CM

## 2018-02-01 DIAGNOSIS — N766 Ulceration of vulva: Secondary | ICD-10-CM | POA: Diagnosis not present

## 2018-02-01 MED ORDER — VALACYCLOVIR HCL 1 G PO TABS: 1000.0000 mg | ORAL_TABLET | Freq: Two times a day (BID) | ORAL | 0 refills | Status: AC

## 2018-02-01 NOTE — Patient Instructions (Signed)
1. 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  First episode.  HSV sure swab done.  Pending results.  Decision to treat now with valacyclovir 1 g p.o. twice a day for 5 days.  We will decide on long-term management per results.  Information given on genital herpes. - SureSwab HSV, Type 1/2 DNA, PCR  2. Screen for STD (sexually transmitted disease) Strict condom use recommended. - HIV antibody (with reflex) - RPR - Hepatitis B Surface AntiGEN - Hepatitis C Antibody - SureSwab HSV, Type 1/2 DNA, PCR - C. trachomatis/N. gonorrhoeae RNA  Other orders - valACYclovir (VALTREX) 1000 MG tablet; Take 1 tablet (1,000 mg total) by mouth 2 (two) times daily for 5 days.  Shannon [name], it was a pleasure seeing you today!  I will inform you of your results as soon as they are available.   Genital Herpes Genital herpes is a common sexually transmitted infection (STI) that is caused by a virus. The virus spreads from person to person through sexual contact. Infection can cause itching, blisters, and sores around the genitals or rectum. Symptoms may last several days and then go away This is called an outbreak. However, the virus remains in your body, so you may have more outbreaks in the future. The time between outbreaks varies and can be months or years. Genital herpes affects men and women. It is particularly concerning for pregnant women because the virus can be passed to the baby during delivery and can cause serious problems. Genital herpes is also a concern for people who have a weak disease-fighting (immune) system. What are the causes? This condition is caused by the herpes simplex virus (HSV) type 1 or type 2. The virus may spread through:  Sexual contact with an infected person, including vaginal, anal, and oral sex.  Contact with fluid from a herpes sore.  The skin. This means that you can get herpes from an infected partner even if he or she does not have a visible sore or does not know that he or she  is infected.  What increases the risk? You are more likely to develop this condition if:  You have sex with many partners.  You do not use latex condoms during sex.  What are the signs or symptoms? Most people do not have symptoms (asymptomatic) or have mild symptoms that may be mistaken for other skin problems. Symptoms may include:  Small red bumps near the genitals, rectum, or mouth. These bumps turn into blisters and then turn into sores.  Flu-like symptoms, including: ? Fever. ? Body aches. ? Swollen lymph nodes. ? Headache.  Painful urination.  Pain and itching in the genital area or rectal area.  Vaginal discharge.  Tingling or shooting pain in the legs and buttocks.  Generally, symptoms are more severe and last longer during the first (primary) outbreak. Flu-like symptoms are also more common during the primary outbreak. How is this diagnosed? Genital herpes may be diagnosed based on:  A physical exam.  Your medical history.  Blood tests.  A test of a fluid sample (culture) from an open sore.  How is this treated? There is no cure for this condition, but treatment with antiviral medicines that are taken by mouth (orally) can do the following:  Speed up healing and relieve symptoms.  Help to reduce the spread of the virus to sexual partners.  Limit the chance of future outbreaks, or make future outbreaks shorter.  Lessen symptoms of future outbreaks.  Your health care provider may also recommend  pain relief medicines, such as aspirin or ibuprofen. Follow these instructions at home: Sexual activity  Do not have sexual contact during active outbreaks.  Practice safe sex. Latex condoms and female condoms may help prevent the spread of the herpes virus. General instructions  Keep the affected areas dry and clean.  Take over-the-counter and prescription medicines only as told by your health care provider.  Avoid rubbing or touching blisters and sores.  If you do touch blisters or sores: ? Wash your hands thoroughly with soap and water. ? Do not touch your eyes afterward.  To help relieve pain or itching, you may take the following actions as directed by your health care provider: ? Apply a cold, wet cloth (cold compress) to affected areas 4-6 times a day. ? Apply a substance that protects your skin and reduces bleeding (astringent). ? Apply a gel that helps relieve pain around sores (lidocaine gel). ? Take a warm, shallow bath that cleans the genital area (sitz bath).  Keep all follow-up visits as told by your health care provider. This is important. How is this prevented?  Use condoms. Although anyone can get genital herpes during sexual contact, even with the use of a condom, a condom can provide some protection.  Avoid having multiple sexual partners.  Talk with your sexual partner about any symptoms either of you may have. Also, talk with your partner about any history of STIs.  Get tested for STIs before you have sex. Ask your partner to do the same.  Do not have sexual contact if you have symptoms of genital herpes. Contact a health care provider if:  Your symptoms are not improving with medicine.  Your symptoms return.  You have new symptoms.  You have a fever.  You have abdominal pain.  You have redness, swelling, or pain in your eye.  You notice new sores on other parts of your body.  You are a woman and experience bleeding between menstrual periods.  You have had herpes and you become pregnant or plan to become pregnant. Summary  Genital herpes is a common sexually transmitted infection (STI) that is caused by the herpes simplex virus (HSV) type 1 or type 2.  These viruses are most often spread through sexual contact with an infected person.  You are more likely to develop this condition if you have sex with many partners or you have unprotected sex.  Most people do not have symptoms (asymptomatic) or have  mild symptoms that may be mistaken for other skin problems. Symptoms occur as outbreaks that may happen months or years apart.  There is no cure for this condition, but treatment with oral antiviral medicines can reduce symptoms, reduce the chance of spreading the virus to a partner, prevent future outbreaks, or shorten future outbreaks. This information is not intended to replace advice given to you by your health care provider. Make sure you discuss any questions you have with your health care provider. Document Released: 10/23/2000 Document Revised: 09/25/2016 Document Reviewed: 09/25/2016 Elsevier Interactive Patient Education  [employer].

## 2018-02-01 NOTE — Progress Notes (Signed)
    Shannon [name] [birth year] [id number]        67 y.o.  [id number] Married  RP: Right burning/painful vulvar ulcer x 5 days  HPI: New sexual partner.  Last time had sex with him was a week ago.  Used condoms.  Vulvar ulcer is burning and progressively more painful x 5 days.  No previous Dx of genital Herpes.  No abnormal vaginal discharge.  No fever.  Urine/BMs wnl.   OB History  Gravida Para Term Preterm AB Living  3 2     1 2   SAB TAB Ectopic Multiple Live Births               # Outcome Date GA Lbr Len/2nd Weight Sex Delivery Anes PTL Lv  3 AB           2 Para           1 Para             Past medical history,surgical history, problem list, medications, allergies, family history and social history were all reviewed and documented in the EPIC chart.   Directed ROS with pertinent positives and negatives documented in the history of present illness/assessment and plan.  Exam:  Vitals:   02/01/18 0816  BP: 128/74   General appearance:  Normal   Gynecologic exam: Vulva: Right posterior ulcer about 1 cm x 0.7 cm.  HSV sure swab done on ulcer.  Vulva otherwise normal.  Speculum: Cervix and vagina normal.  Gonorrhea and Chlamydia done on cervix.   Assessment/Plan:  67 y.o. [id number]   1. 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  First episode.  HSV sure swab done.  Pending results.  Decision to treat now with valacyclovir 1 g p.o. twice a day for 5 days.  We will decide on long-term management per results.  Information given on genital herpes. - SureSwab HSV, Type 1/2 DNA, PCR  2. Screen for STD (sexually transmitted disease) Strict condom use recommended. - HIV antibody (with reflex) - RPR - Hepatitis B Surface AntiGEN - Hepatitis C Antibody - SureSwab HSV, Type 1/2 DNA, PCR - C. trachomatis/N. gonorrhoeae RNA  Other orders - valACYclovir (VALTREX) 1000 MG tablet; Take 1 tablet (1,000 mg total) by mouth 2 (two) times daily for 5 days.  Counseling and coordination of care  on above issues more than 50% for 25 minutes.  Princess Bruins MD, 8:25 AM 02/01/2018

## 2018-02-02 LAB — C. TRACHOMATIS/N. GONORRHOEAE RNA
C. trachomatis RNA, TMA: NOT DETECTED
N. gonorrhoeae RNA, TMA: NOT DETECTED

## 2018-02-02 LAB — SURESWAB HSV, TYPE 1/2 DNA, PCR
HSV 1 DNA: NOT DETECTED
HSV 2 DNA: DETECTED — AB

## 2018-02-02 LAB — HIV ANTIBODY (ROUTINE TESTING W REFLEX): HIV: NONREACTIVE

## 2018-02-02 LAB — HEPATITIS B SURFACE ANTIGEN: Hepatitis B Surface Ag: NONREACTIVE

## 2018-02-02 LAB — HEPATITIS C ANTIBODY
HEP C AB: NONREACTIVE
SIGNAL TO CUT-OFF: 0.02 (ref <1.00)

## 2018-02-02 LAB — RPR: RPR Ser Ql: NONREACTIVE

## 2018-02-10 ENCOUNTER — Ambulatory Visit: Payer: Commercial Managed Care - PPO | Admitting: Obstetrics & Gynecology

## 2018-02-10 ENCOUNTER — Encounter: Payer: Self-pay | Admitting: Obstetrics & Gynecology

## 2018-02-10 VITALS — BP 146/84

## 2018-02-10 DIAGNOSIS — A6004 Herpesviral vulvovaginitis: Secondary | ICD-10-CM

## 2018-02-10 MED ORDER — VALACYCLOVIR HCL 500 MG PO TABS: 500.0000 mg | ORAL_TABLET | Freq: Every day | ORAL | 4 refills | Status: DC

## 2018-02-10 NOTE — Patient Instructions (Signed)
1. 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  Diagnosis, natural history and treatment thoroughly discussed with patient.  Prophylaxis versus treatment of episodes with valacyclovir explained.  Benefits of prophylaxis discussed given that this was a first infection.  Decision made to start on valacyclovir 500 mg per mouth daily prophylactically.  Importance of protecting with condoms reviewed.  Patient informed that the rest of her STD screening was negative.  Patient voiced understanding and agreement with the plan.  Other orders - valACYclovir (VALTREX) 500 MG tablet; Take 1 tablet (500 mg total) by mouth daily. Prophylaxis  Shannon [name], good seeing you today!   Genital Herpes Genital herpes is a common sexually transmitted infection (STI) that is caused by a virus. The virus spreads from person to person through sexual contact. Infection can cause itching, blisters, and sores around the genitals or rectum. Symptoms may last several days and then go away This is called an outbreak. However, the virus remains in your body, so you may have more outbreaks in the future. The time between outbreaks varies and can be months or years. Genital herpes affects men and women. It is particularly concerning for pregnant women because the virus can be passed to the baby during delivery and can cause serious problems. Genital herpes is also a concern for people who have a weak disease-fighting (immune) system. What are the causes? This condition is caused by the herpes simplex virus (HSV) type 1 or type 2. The virus may spread through:  Sexual contact with an infected person, including vaginal, anal, and oral sex.  Contact with fluid from a herpes sore.  The skin. This means that you can get herpes from an infected partner even if he or she does not have a visible sore or does not know that he or she is infected.  What increases the risk? You are more likely  to develop this condition if:  You have sex with many partners.  You do not use latex condoms during sex.  What are the signs or symptoms? Most people do not have symptoms (asymptomatic) or have mild symptoms that may be mistaken for other skin problems. Symptoms may include:  Small red bumps near the genitals, rectum, or mouth. These bumps turn into blisters and then turn into sores.  Flu-like symptoms, including: ? Fever. ? Body aches. ? Swollen lymph nodes. ? Headache.  Painful urination.  Pain and itching in the genital area or rectal area.  Vaginal discharge.  Tingling or shooting pain in the legs and buttocks.  Generally, symptoms are more severe and last longer during the first (primary) outbreak. Flu-like symptoms are also more common during the primary outbreak. How is this diagnosed? Genital herpes may be diagnosed based on:  A physical exam.  Your medical history.  Blood tests.  A test of a fluid sample (culture) from an open sore.  How is this treated? There is no cure for this condition, but treatment with antiviral medicines that are taken by mouth (orally) can do the following:  Speed up healing and relieve symptoms.  Help to reduce the spread of the virus to sexual partners.  Limit the chance of future outbreaks, or make future outbreaks shorter.  Lessen symptoms of future outbreaks.  Your health care provider may also recommend pain relief medicines, such as aspirin or ibuprofen. Follow these instructions at home: Sexual activity  Do not have sexual contact during active outbreaks.  Practice safe sex. Latex condoms  and female condoms may help prevent the spread of the herpes virus. General instructions  Keep the affected areas dry and clean.  Take over-the-counter and prescription medicines only as told by your health care provider.  Avoid rubbing or touching blisters and sores. If you do touch blisters or sores: ? Wash your hands  thoroughly with soap and water. ? Do not touch your eyes afterward.  To help relieve pain or itching, you may take the following actions as directed by your health care provider: ? Apply a cold, wet cloth (cold compress) to affected areas 4-6 times a day. ? Apply a substance that protects your skin and reduces bleeding (astringent). ? Apply a gel that helps relieve pain around sores (lidocaine gel). ? Take a warm, shallow bath that cleans the genital area (sitz bath).  Keep all follow-up visits as told by your health care provider. This is important. How is this prevented?  Use condoms. Although anyone can get genital herpes during sexual contact, even with the use of a condom, a condom can provide some protection.  Avoid having multiple sexual partners.  Talk with your sexual partner about any symptoms either of you may have. Also, talk with your partner about any history of STIs.  Get tested for STIs before you have sex. Ask your partner to do the same.  Do not have sexual contact if you have symptoms of genital herpes. Contact a health care provider if:  Your symptoms are not improving with medicine.  Your symptoms return.  You have new symptoms.  You have a fever.  You have abdominal pain.  You have redness, swelling, or pain in your eye.  You notice new sores on other parts of your body.  You are a woman and experience bleeding between menstrual periods.  You have had herpes and you become pregnant or plan to become pregnant. Summary  Genital herpes is a common sexually transmitted infection (STI) that is caused by the herpes simplex virus (HSV) type 1 or type 2.  These viruses are most often spread through sexual contact with an infected person.  You are more likely to develop this condition if you have sex with many partners or you have unprotected sex.  Most people do not have symptoms (asymptomatic) or have mild symptoms that may be mistaken for other skin  problems. Symptoms occur as outbreaks that may happen months or years apart.  There is no cure for this condition, but treatment with oral antiviral medicines can reduce symptoms, reduce the chance of spreading the virus to a partner, prevent future outbreaks, or shorten future outbreaks. This information is not intended to replace advice given to you by your health care provider. Make sure you discuss any questions you have with your health care provider. Document Released: 10/23/2000 Document Revised: 09/25/2016 Document Reviewed: 09/25/2016 Elsevier Interactive Patient Education  [employer].

## 2018-02-10 NOTE — Progress Notes (Signed)
    Shannon [name] 01/27/1951 [id number]        67 y.o.  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  HSV-2 Sureswab positive.   That was patient's first episode of genital Herpes.  Full STD w-up otherwise negative on 02/01/2018.   OB History  Gravida Para Term Preterm AB Living  3 2     1 2   SAB TAB Ectopic Multiple Live Births               # Outcome Date GA Lbr Len/2nd Weight Sex Delivery Anes PTL Lv  3 AB           2 Para           1 Para             Past medical history,surgical history, problem list, medications, allergies, family history and social history were all reviewed and documented in the EPIC chart.   Directed ROS with pertinent positives and negatives documented in the history of present illness/assessment and plan.  Exam:  Vitals:   02/10/18 1426  BP: (!) 146/84   General appearance:  Normal   Assessment/Plan:  67 y.o. [id number]   1. 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  Diagnosis, natural history and treatment thoroughly discussed with patient.  Prophylaxis versus treatment of episodes with valacyclovir explained.  Benefits of prophylaxis discussed given that this was a first infection.  Decision made to start on valacyclovir 500 mg per mouth daily prophylactically.  Importance of protecting with condoms reviewed.  Patient informed that the rest of her STD screening was negative.  Patient voiced understanding and agreement with the plan.  Other orders - valACYclovir (VALTREX) 500 MG tablet; Take 1 tablet (500 mg total) by mouth daily. Prophylaxis  Counseling on above issues and coordination of care more than 50% of 25 minutes.  Princess Bruins MD, 2:37 PM 02/10/2018

## 2018-02-15 ENCOUNTER — Encounter: Payer: Self-pay | Admitting: Gastroenterology

## 2018-02-15 ENCOUNTER — Ambulatory Visit (AMBULATORY_SURGERY_CENTER): Payer: Self-pay

## 2018-02-15 VITALS — Ht 66.0 in | Wt 134.4 lb

## 2018-02-15 DIAGNOSIS — Z1211 Encounter for screening for malignant neoplasm of colon: Secondary | ICD-10-CM

## 2018-02-15 MED ORDER — NA SULFATE-K SULFATE-MG SULF 17.5-3.13-1.6 GM/177ML PO SOLN: 1.0000 | Freq: Once | ORAL | 0 refills | Status: AC

## 2018-02-15 NOTE — Progress Notes (Signed)
Per pt, no allergies to soy or egg products.Pt not taking any weight loss meds or using  O2 at home.  Emmi video sent to pt's email. 

## 2018-02-17 ENCOUNTER — Other Ambulatory Visit: Payer: Self-pay | Admitting: Family Medicine

## 2018-02-17 DIAGNOSIS — I10 Essential (primary) hypertension: Secondary | ICD-10-CM

## 2018-02-17 MED ORDER — NEBIVOLOL HCL 20 MG PO TABS: 1.0000 | ORAL_TABLET | Freq: Every day | ORAL | 2 refills | Status: DC

## 2018-02-17 MED ORDER — LOSARTAN POTASSIUM-HCTZ 100-25 MG PO TABS: 1.0000 | ORAL_TABLET | Freq: Every day | ORAL | 2 refills | Status: DC

## 2018-02-18 ENCOUNTER — Telehealth: Payer: Self-pay | Admitting: Family Medicine

## 2018-02-18 ENCOUNTER — Telehealth: Payer: Self-pay | Admitting: Adult Health

## 2018-02-18 DIAGNOSIS — I10 Essential (primary) hypertension: Secondary | ICD-10-CM

## 2018-02-18 MED ORDER — NEBIVOLOL HCL 20 MG PO TABS: 1.0000 | ORAL_TABLET | Freq: Every day | ORAL | 0 refills | Status: DC

## 2018-02-18 MED ORDER — LOSARTAN POTASSIUM-HCTZ 100-25 MG PO TABS: 1.0000 | ORAL_TABLET | Freq: Every day | ORAL | 0 refills | Status: DC

## 2018-02-18 NOTE — Telephone Encounter (Signed)
Pt called to request a 30 day supply of Bystolic & Losartan-HCTZ to [facility], she is currently waiting on mail order.

## 2018-02-18 NOTE — Telephone Encounter (Signed)
Sent to the pharmacy by e-scribe. 

## 2018-02-18 NOTE — Telephone Encounter (Signed)
Copied from [place] [id number]. Topic: Quick Communication - Rx Refill/Question >> Feb 18, 2018  8:28 AM Yvette Rack wrote: Medication: losartan-hydrochlorothiazide (HYZAAR) 100-25 MG tablet   Nebivolol HCl (BYSTOLIC) 20 MG TABS  Has the patient contacted their pharmacy? Yes.   (Agent: If no, request that the patient contact the pharmacy for the refill.) Preferred Pharmacy (with phone number or street name): [place], [place] [phone] (Phone) [phone] (Fax)     Agent: Please be advised that RX refills may take up to 3 business days. We ask that you follow-up with your pharmacy.

## 2018-02-18 NOTE — Telephone Encounter (Signed)
Rx was sent to [facility] yesterday- left message for patient that the Rx requested have been sent to pharmacy.

## 2018-02-24 ENCOUNTER — Telehealth: Payer: Self-pay | Admitting: Adult Health

## 2018-02-24 DIAGNOSIS — I10 Essential (primary) hypertension: Secondary | ICD-10-CM

## 2018-02-24 MED ORDER — NEBIVOLOL HCL 20 MG PO TABS: 1.0000 | ORAL_TABLET | Freq: Every day | ORAL | 2 refills | Status: DC

## 2018-02-24 MED ORDER — LOSARTAN POTASSIUM-HCTZ 100-25 MG PO TABS: 1.0000 | ORAL_TABLET | Freq: Every day | ORAL | 2 refills | Status: DC

## 2018-02-24 NOTE — Telephone Encounter (Signed)
Sent to the pharmacy by e-scribe. 

## 2018-02-24 NOTE — Telephone Encounter (Signed)
Nebivolol HCl (BYSTOLIC) 20 MG TABS   losartan-hydrochlorothiazide (HYZAAR) 100-25 MG tablet    Send to:  [facility], [place] [phone] (Phone) [phone] (Fax)

## 2018-02-24 NOTE — Telephone Encounter (Signed)
Filled by gynecology 02/10/18.

## 2018-02-24 NOTE — Telephone Encounter (Signed)
(  Dr. Dellis Filbert is out of the office this afternoon).  Patient seeking a refill of her blood pressure medication. Patient advised to contact her PCP whom prescribes the Rx. Dr. Dellis Filbert handles the gynecological needs. Patient will call her PCP.

## 2018-02-24 NOTE — Telephone Encounter (Signed)
Copied from [place]. Topic: Quick Communication - Rx Refill/Question >> Feb 24, 2018 10:09 AM Synthia Innocent wrote: Medication: valACYclovir (VALTREX) 1gram tablet  Has the patient contacted their pharmacy? Yes.   (Agent: If no, request that the patient contact the pharmacy for the refill.) Preferred Pharmacy (with phone number or street name): [facility] Agent: Please be advised that RX refills may take up to 3 business days. We ask that you follow-up with your pharmacy. Ref# [id number]

## 2018-02-24 NOTE — Telephone Encounter (Signed)
Call to patient- left message that we received message regarding a refill for a medication that she is receiving from her GYN provider. It may be best if she discusses filling that medication with her- so we are not duplicating medication Rx's. Please call back if she has any questions.

## 2018-02-28 ENCOUNTER — Encounter: Payer: Commercial Managed Care - PPO | Admitting: Obstetrics & Gynecology

## 2018-02-28 DIAGNOSIS — Z0289 Encounter for other administrative examinations: Secondary | ICD-10-CM

## 2018-03-01 ENCOUNTER — Encounter: Payer: Commercial Managed Care - PPO | Admitting: Gastroenterology

## 2018-03-01 ENCOUNTER — Telehealth: Payer: Self-pay | Admitting: Gastroenterology

## 2018-03-01 NOTE — Telephone Encounter (Signed)
Pt just called cancelling proc scheduled for this morning at 10:30am. She stated that her pharmacy did not receive prep. Thank you.

## 2018-03-01 NOTE — Telephone Encounter (Signed)
Hi Dr. Silverio Decamp, this pt cancelled her procedure for this morning at 10:30am. She stated that her pharmacy did not receive prep. Thank you.

## 2018-03-01 NOTE — Telephone Encounter (Signed)
Ok to charge late cancellation, patient should have called earlier as the Rx is usually sent when the procedure is scheduled.

## 2018-03-02 ENCOUNTER — Telehealth: Payer: Self-pay

## 2018-03-02 DIAGNOSIS — Z1211 Encounter for screening for malignant neoplasm of colon: Secondary | ICD-10-CM

## 2018-03-02 MED ORDER — NA SULFATE-K SULFATE-MG SULF 17.5-3.13-1.6 GM/177ML PO SOLN: ORAL | 0 refills | Status: DC

## 2018-03-02 NOTE — Telephone Encounter (Signed)
Agree 

## 2018-03-02 NOTE — Telephone Encounter (Signed)
suprep sent to pharmacy as per requested by the patient. Called pt, no answer, left message to notify her that rx was sent today.

## 2018-03-30 ENCOUNTER — Ambulatory Visit: Payer: Commercial Managed Care - PPO | Admitting: Adult Health

## 2018-03-30 ENCOUNTER — Encounter: Payer: Self-pay | Admitting: Adult Health

## 2018-03-30 VITALS — BP 150/80 | Temp 98.3°F | Wt 128.0 lb

## 2018-03-30 DIAGNOSIS — H6122 Impacted cerumen, left ear: Secondary | ICD-10-CM | POA: Diagnosis not present

## 2018-03-30 NOTE — Progress Notes (Signed)
Subjective:    Patient ID: Shannon [name], female    DOB: 1951/03/11, 67 y.o.   MRN: [id number]  HPI 67 year old female who  has a past medical history of Allergy, Anemia, Bronchitis, GERD (gastroesophageal reflux disease), Headache(784.0), Hypertension, and Osteopenia.  She presents to the office today for the complaint of decreased hearing in left ear. She feels as though it may be filled with ear wax. Denies any pain or drainage.    Review of Systems See HPI   Past Medical History:  Diagnosis Date  . Allergy   . Anemia   . Bronchitis    in past  . GERD (gastroesophageal reflux disease)   . Headache(784.0)   . Hypertension   . Osteopenia     Social History   Socioeconomic History  . Marital status: Married    Spouse name: Not on file  . Number of children: Not on file  . Years of education: Not on file  . Highest education level: Not on file  Occupational History  . Not on file  Social Needs  . Financial resource strain: Not on file  . Food insecurity:    Worry: Not on file    Inability: Not on file  . Transportation needs:    Medical: Not on file    Non-medical: Not on file  Tobacco Use  . Smoking status: Current Some Day Smoker  . Smokeless tobacco: Never Used  Substance and Sexual Activity  . Alcohol use: Yes    Alcohol/week: 3.0 oz    Types: 5 Glasses of wine per week  . Drug use: Not Currently    Types: Marijuana  . Sexual activity: Yes  Lifestyle  . Physical activity:    Days per week: Not on file    Minutes per session: Not on file  . Stress: Not on file  Relationships  . Social connections:    Talks on phone: Not on file    Gets together: Not on file    Attends religious service: Not on file    Active member of club or organization: Not on file    Attends meetings of clubs or organizations: Not on file    Relationship status: Not on file  . Intimate partner violence:    Fear of current or ex partner: Not on file    Emotionally abused:  Not on file    Physically abused: Not on file    Forced sexual activity: Not on file  Other Topics Concern  . 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 BREAST LUMPECTOMY     left breast /benign  . TUBAL LIGATION      Family History  Problem Relation Age of Onset  . Hypertension Mother        age 62  . Alzheimer's disease Father        deceased secondary to alzheimer's disease    Allergies  Allergen Reactions  . Penicillins Shortness Of Breath  . Corn-Containing Products Itching    Current Outpatient Medications on File Prior to Visit  Medication Sig Dispense Refill  . Ascorbic Acid (VITAMIN C) 1000 MG tablet Take 1,000 mg  by mouth daily.    Marland Kitchen losartan-hydrochlorothiazide (HYZAAR) 100-25 MG tablet Take 1 tablet by mouth daily. 90 tablet 2  . Na Sulfate-K Sulfate-Mg Sulf 17.5-3.13-1.6 GM/177ML SOLN Suprep (no substitutions)-TAKE AS DIRECTED. 354 mL 0  . Nebivolol HCl (BYSTOLIC) 20 MG TABS Take 1 tablet (20 mg total) by mouth daily. 90 tablet 2  . valACYclovir (VALTREX) 1000 MG tablet Take 1,000 mg by mouth 2 (two) times daily.    . valACYclovir (VALTREX) 500 MG tablet Take 1 tablet (500 mg total) by mouth daily. Prophylaxis 90 tablet 4  . vitamin E 1000 UNIT capsule Take 1,000 Units by mouth daily.    Marland Kitchen zolpidem (AMBIEN) 5 MG tablet take 1 tablet by mouth at bedtime if needed 30 tablet 2   No current facility-administered medications on file prior to visit.     BP (!) 150/80 Comment: HAS NOT HAD MEDICATION TODAY  Temp 98.3 F (36.8 C) (Oral)   Wt 128 lb (58.1 kg)   BMI 20.66 kg/m       Objective:   Physical Exam  Constitutional: She is oriented to person, place, and time. She appears well-developed and well-nourished. No distress.  HENT:  Right Ear:  Hearing, tympanic membrane, external ear and ear canal normal.  Left Ear: Hearing normal.  Cerumen impaction in left ear canal. TM not visualized.   Cardiovascular: Exam reveals no gallop.  Neurological: She is alert and oriented to person, place, and time.  Skin: Skin is warm and dry. She is not diaphoretic.  Psychiatric: She has a normal mood and affect. Her behavior is normal. Judgment and thought content normal.  Nursing note and vitals reviewed.     Assessment & Plan:  1. Impacted cerumen of left ear - Verbal consent obtained. Cerumen impaction was easily removed with irrigation. Patient tolerated procedure well. No signs of infection noted.   Dorothyann Peng, NP

## 2018-04-02 ENCOUNTER — Other Ambulatory Visit: Payer: Self-pay | Admitting: Adult Health

## 2018-04-02 DIAGNOSIS — I10 Essential (primary) hypertension: Secondary | ICD-10-CM

## 2018-04-05 NOTE — Telephone Encounter (Signed)
Sent to the pharmacy by e-scribe. 

## 2018-04-07 ENCOUNTER — Encounter: Payer: Commercial Managed Care - PPO | Admitting: Gastroenterology

## 2018-06-20 ENCOUNTER — Other Ambulatory Visit: Payer: Self-pay | Admitting: Adult Health

## 2018-06-20 DIAGNOSIS — G4700 Insomnia, unspecified: Secondary | ICD-10-CM

## 2018-06-21 NOTE — Telephone Encounter (Signed)
Last filled 11/25/17 for 3 months. Please advise.

## 2018-06-21 NOTE — Telephone Encounter (Signed)
Called to the pharmacy and give over telephone.  No further action required.

## 2018-06-21 NOTE — Telephone Encounter (Signed)
Call in #10 with no rf

## 2018-08-20 ENCOUNTER — Other Ambulatory Visit: Payer: Self-pay | Admitting: Family Medicine

## 2018-08-20 DIAGNOSIS — G4700 Insomnia, unspecified: Secondary | ICD-10-CM

## 2018-08-23 NOTE — Telephone Encounter (Signed)
I will forward this to Cory  

## 2018-08-23 NOTE — Telephone Encounter (Signed)
Dr. Sarajane Jews please advise of refills. Last refilled 06/2018

## 2018-10-28 ENCOUNTER — Other Ambulatory Visit: Payer: Self-pay | Admitting: Adult Health

## 2018-10-28 DIAGNOSIS — I10 Essential (primary) hypertension: Secondary | ICD-10-CM

## 2018-10-31 NOTE — Telephone Encounter (Signed)
Sent to the pharmacy by e-scribe. 

## 2019-01-31 ENCOUNTER — Ambulatory Visit (INDEPENDENT_AMBULATORY_CARE_PROVIDER_SITE_OTHER): Payer: Self-pay | Admitting: Adult Health

## 2019-01-31 ENCOUNTER — Encounter: Payer: Self-pay | Admitting: Adult Health

## 2019-01-31 ENCOUNTER — Other Ambulatory Visit: Payer: Self-pay

## 2019-01-31 DIAGNOSIS — J302 Other seasonal allergic rhinitis: Secondary | ICD-10-CM

## 2019-01-31 MED ORDER — FLUTICASONE PROPIONATE 50 MCG/ACT NA SUSP: 2.0000 | Freq: Every day | NASAL | 3 refills | Status: DC

## 2019-01-31 NOTE — Progress Notes (Signed)
Virtual Visit via Telephone Note  I connected with Shannon [name] on 01/31/19 at  2:30 PM EDT by telephone and verified that I am speaking with the correct person using two identifiers.   I discussed the limitations, risks, security and privacy concerns of performing an evaluation and management service by telephone and the availability of in person appointments. I also discussed with the patient that there may be a patient responsible charge related to this service. The patient expressed understanding and agreed to proceed.  Location patient: home Location provider: work or home office Participants present for the call: patient, provider Patient did not have a visit in the prior 7 days to address this/these issue(s).   History of Present Illness: Patient related for an acute issue.  Her symptoms include that of dry cough, PND, nasal congestion, area with yellow discharge and watery eyes.  She denies chest pain, fever, chills, shortness of breath, or wheezing.  She has been using DayQuil at home without improvement    Observations/Objective: Patient sounds cheerful and well on the phone. I do not appreciate any SOB. Speech and thought processing are grossly intact. Patient reported vitals:  Assessment and Plan: No concern for Covidien 19.  Does not appear to be sinusitis, bronchitis, pneumonia.  Exam more consistent with seasonal allergies.  She was advised to use Flonase and Claritin.   Follow Up Instructions: If her symptoms worsen or she develops a fever within the next 2 to 3 days she will follow-up   I discussed the assessment and treatment plan with the patient. The patient was provided an opportunity to ask questions and all were answered. The patient agreed with the plan and demonstrated an understanding of the instructions.   The patient was advised to call back or seek an in-person evaluation if the symptoms worsen or if the condition fails to improve as anticipated.  I  provided 18 minutes of non-face-to-face time during this encounter.   Shannon Peng, NP

## 2019-02-01 ENCOUNTER — Telehealth: Payer: Self-pay | Admitting: Family Medicine

## 2019-02-01 NOTE — Telephone Encounter (Signed)
Copied from [place] [id number]. Topic: General - Inquiry >> Feb 01, 2019 12:02 PM Vernona Rieger wrote: Reason for CRM: patient would like a note for her visit yesterday with Tommi Rumps - per her employer she has to have this by 2pm because she is scheduled to work. She said her work would even accept a call to her Manager Vickii Penna @  [phone]. Please Advise.

## 2019-02-02 ENCOUNTER — Encounter: Payer: Self-pay | Admitting: Family Medicine

## 2019-02-02 NOTE — Telephone Encounter (Signed)
[place] for note or for call to her employer

## 2019-02-02 NOTE — Telephone Encounter (Signed)
Letter printed and gave to the pt.  Nothing further needed.

## 2019-02-08 ENCOUNTER — Other Ambulatory Visit: Payer: Self-pay | Admitting: Adult Health

## 2019-02-08 DIAGNOSIS — I10 Essential (primary) hypertension: Secondary | ICD-10-CM

## 2019-02-08 MED ORDER — LOSARTAN POTASSIUM-HCTZ 100-25 MG PO TABS: 1.0000 | ORAL_TABLET | Freq: Every day | ORAL | 1 refills | Status: DC

## 2019-02-08 NOTE — Telephone Encounter (Signed)
Requested Prescriptions  Pending Prescriptions Disp Refills  . losartan-hydrochlorothiazide (HYZAAR) 100-25 MG tablet 90 tablet 1    Sig: Take 1 tablet by mouth daily.     Cardiovascular: ARB + Diuretic Combos Failed - 02/08/2019  8:03 AM      Failed - K in normal range and within 180 days    Potassium  Date Value Ref Range Status  05/27/2017 4.0 3.5 - 5.1 mEq/L Final         Failed - Na in normal range and within 180 days    Sodium  Date Value Ref Range Status  05/27/2017 139 135 - 145 mEq/L Final         Failed - Cr in normal range and within 180 days    Creat  Date Value Ref Range Status  06/04/2011 0.88 0.50 - 1.10 mg/dL Final   Creatinine, Ser  Date Value Ref Range Status  05/27/2017 0.78 0.40 - 1.20 mg/dL Final         Failed - Ca in normal range and within 180 days    Calcium  Date Value Ref Range Status  05/27/2017 10.9 (H) 8.4 - 10.5 mg/dL Final         Failed - Last BP in normal range    BP Readings from Last 1 Encounters:  03/30/18 (!) 150/80         Passed - Patient is not pregnant      Passed - Valid encounter within last 6 months    Recent Outpatient Visits          1 week ago Seasonal allergies   Therapist, music at [facility], [place], NP   10 months ago Impacted cerumen of left Neurosurgeon at [facility], [place], NP   1 year ago Routine general medical examination at a health care facility   [employer] [facility], [place], NP   1 year ago Weight loss, unintentional   Therapist, music at [facility], [place], NP   2 years ago Routine general medical examination at a health care facility   [employer] at [facility], [place], NP

## 2019-02-14 ENCOUNTER — Other Ambulatory Visit: Payer: Self-pay | Admitting: Obstetrics & Gynecology

## 2019-02-20 ENCOUNTER — Other Ambulatory Visit: Payer: Self-pay

## 2019-02-21 ENCOUNTER — Ambulatory Visit (INDEPENDENT_AMBULATORY_CARE_PROVIDER_SITE_OTHER): Payer: Self-pay | Admitting: Obstetrics & Gynecology

## 2019-02-21 ENCOUNTER — Encounter: Payer: Self-pay | Admitting: Obstetrics & Gynecology

## 2019-02-21 VITALS — BP 134/78 | Ht 64.0 in | Wt 145.0 lb

## 2019-02-21 DIAGNOSIS — Z1151 Encounter for screening for human papillomavirus (HPV): Secondary | ICD-10-CM

## 2019-02-21 DIAGNOSIS — Z8619 Personal history of other infectious and parasitic diseases: Secondary | ICD-10-CM

## 2019-02-21 DIAGNOSIS — M8589 Other specified disorders of bone density and structure, multiple sites: Secondary | ICD-10-CM

## 2019-02-21 DIAGNOSIS — Z780 Asymptomatic menopausal state: Secondary | ICD-10-CM

## 2019-02-21 DIAGNOSIS — Z01419 Encounter for gynecological examination (general) (routine) without abnormal findings: Secondary | ICD-10-CM

## 2019-02-21 MED ORDER — VALACYCLOVIR HCL 500 MG PO TABS: 500.0000 mg | ORAL_TABLET | Freq: Every day | ORAL | 4 refills | Status: DC

## 2019-02-21 NOTE — Progress Notes (Signed)
KANI JOBSON [birth year] [id number]   History:    68 y.o. G3P2A1L2 Married  RP:  Established patient presenting for annual gyn exam   HPI: Postmenopausal, well on no HRT.  No PMB.  No pelvic pain.  H/O Genital Herpes Dx 01/2018.  Well on Valacyclovir prophylaxis 500 mg daily.  No recurrence since 01/2018.  Sexually active, no pain with IC.  Urine/BMs normal.  Breasts normal.  BMI 24.89.  Walking.  Healthy nutrition.  Health labs with Fam MD.  Past medical history,surgical history, family history and social history were all reviewed and documented in the EPIC chart.  Gynecologic History No LMP recorded. (Menstrual status: Other). Contraception: post menopausal status Last Pap: 06/2014. Results were: Negative (low risk HPV present) Last mammogram: 04/2017. Results were: Negative Bone Density: 03/2017 Osteopenia, will schedule for 04/2019 Colonoscopy: 2018  Obstetric History OB History  Gravida Para Term Preterm AB Living  3 2     1 2   SAB TAB Ectopic Multiple Live Births               # Outcome Date GA Lbr Len/2nd Weight Sex Delivery Anes PTL Lv  3 AB           2 Para           1 Para              ROS: A ROS was performed and pertinent positives and negatives are included in the history.  GENERAL: No fevers or chills. HEENT: No change in vision, no earache, sore throat or sinus congestion. NECK: No pain or stiffness. CARDIOVASCULAR: No chest pain or pressure. No palpitations. PULMONARY: No shortness of breath, cough or wheeze. GASTROINTESTINAL: No abdominal pain, nausea, vomiting or diarrhea, melena or bright red blood per rectum. GENITOURINARY: No urinary frequency, urgency, hesitancy or dysuria. MUSCULOSKELETAL: No joint or muscle pain, no back pain, no recent trauma. DERMATOLOGIC: No rash, no itching, no lesions. ENDOCRINE: No polyuria, polydipsia, no heat or cold intolerance. No recent change in weight. HEMATOLOGICAL: No anemia or easy bruising or bleeding. NEUROLOGIC: No  headache, seizures, numbness, tingling or weakness. PSYCHIATRIC: No depression, no loss of interest in normal activity or change in sleep pattern.     Exam:   BP 134/78   Ht 5\' 4"  (1.626 m)   Wt 145 lb (65.8 kg)   BMI 24.89 kg/m   Body mass index is 24.89 kg/m.  General appearance : Well developed well nourished female. No acute distress HEENT: Eyes: no retinal hemorrhage or exudates,  Neck supple, trachea midline, no carotid bruits, no thyroidmegaly Lungs: Clear to auscultation, no rhonchi or wheezes, or rib retractions  Heart: Regular rate and rhythm, no murmurs or gallops Breast:Examined in sitting and supine position were symmetrical in appearance, no palpable masses or tenderness,  no skin retraction, no nipple inversion, no nipple discharge, no skin discoloration, no axillary or supraclavicular lymphadenopathy Abdomen: no palpable masses or tenderness, no rebound or guarding Extremities: no edema or skin discoloration or tenderness  Pelvic: Vulva: Normal             Vagina: No gross lesions or discharge  Cervix: No gross lesions or discharge.  Pap/HPV HR done  Uterus  AV, normal size, shape and consistency, non-tender and mobile  Adnexa  Without masses or tenderness  Anus: Normal   Assessment/Plan:  68 y.o. female for annual exam   1. Encounter for routine gynecological examination with Papanicolaou smear of cervix Normal gynecologic exam.  Pap with high-risk HPV done today.  Breast exam normal.  Patient will schedule screening mammogram at [facility].  Good body mass index at 24.89.  Continue with daily walking and healthy nutrition.  Health labs with family physician.  2. Postmenopause Well on no hormone replacement therapy.  No postmenopausal bleeding.  3. Osteopenia of multiple sites Osteopenia on bone density in May 2018.  Will repeat a bone density in June 2020.  Recommend vitamin D supplements, calcium intake of 1200 to 1500 mg daily including nutritional and  supplements, regular weightbearing physical activities. - DG Bone Density; Future  4. Hx of herpes genitalis No recurrence of genital herpes on valacyclovir prophylaxis 500 mg daily since March 2019.  No contraindication to continue.  Prescription sent to pharmacy.  Other orders - valACYclovir (VALTREX) 500 MG tablet; Take 1 tablet (500 mg total) by mouth daily. Prophylaxis  Princess Bruins MD, 9:18 AM 02/21/2019

## 2019-02-21 NOTE — Addendum Note (Signed)
Addended by: Thurnell Garbe A on: 02/21/2019 11:27 AM   Modules accepted: Orders

## 2019-02-21 NOTE — Patient Instructions (Signed)
1. Encounter for routine gynecological examination with Papanicolaou smear of cervix Normal gynecologic exam.  Pap with high-risk HPV done today.  Breast exam normal.  Patient will schedule screening mammogram at [facility].  Good body mass index at 24.89.  Continue with daily walking and healthy nutrition.  Health labs with family physician.  2. Postmenopause Well on no hormone replacement therapy.  No postmenopausal bleeding.  3. Osteopenia of multiple sites Osteopenia on bone density in May 2018.  Will repeat a bone density in June 2020.  Recommend vitamin D supplements, calcium intake of 1200 to 1500 mg daily including nutritional and supplements, regular weightbearing physical activities. - DG Bone Density; Future  4. Hx of herpes genitalis No recurrence of genital herpes on valacyclovir prophylaxis 500 mg daily since March 2019.  No contraindication to continue.  Prescription sent to pharmacy.  Other orders - valACYclovir (VALTREX) 500 MG tablet; Take 1 tablet (500 mg total) by mouth daily. Prophylaxis  Shannon [name], it was a pleasure seeing you today!  I will inform you of your results as soon as they are available.

## 2019-02-22 LAB — PAP, TP IMAGING W/ HPV RNA, RFLX HPV TYPE 16,18/45: HPV DNA High Risk: NOT DETECTED

## 2019-03-05 ENCOUNTER — Other Ambulatory Visit: Payer: Self-pay | Admitting: Adult Health

## 2019-03-05 DIAGNOSIS — I10 Essential (primary) hypertension: Secondary | ICD-10-CM

## 2019-06-14 ENCOUNTER — Telehealth: Payer: Self-pay | Admitting: Adult Health

## 2019-06-14 NOTE — Telephone Encounter (Signed)
Copied from [place] [id number]. Topic: Appointment Scheduling - Scheduling Inquiry for Clinic >> Jun 14, 2019 10:32 AM Scherrie Gerlach wrote: Reason for CRM: pt needs TB test for a new job.  Pt needs the test before they will see her. Pt needs nurse visit, or visit with Tommi Rumps to have it done if she is due for ov. Pt needs this TB skin test asap. >> Jun 14, 2019 11:17 AM Cox, Melburn Hake, CMA wrote: Pt can be added to the [place] schedule on Friday. If this is not convenient pt can go to any UC or the Health Dept.    LMVM for the patient to contact the office to schedule a nurse visit for her TB skin test

## 2019-08-22 ENCOUNTER — Telehealth: Payer: Self-pay | Admitting: Adult Health

## 2019-08-22 ENCOUNTER — Ambulatory Visit: Payer: Self-pay | Admitting: Family Medicine

## 2019-08-22 NOTE — Patient Instructions (Signed)
Health Maintenance Due  Topic Date Due  . COLONOSCOPY  12/17/2014  . PNA vac Low Risk Adult (2 of 2 - PPSV23) 11/25/2018  . TETANUS/TDAP  12/17/2018  . MAMMOGRAM  04/17/2019  . INFLUENZA VACCINE  06/10/2019    Depression screen PHQ 2/9 11/25/2017  Decreased Interest 0  Down, Depressed, Hopeless 0  PHQ - 2 Score 0

## 2019-09-13 ENCOUNTER — Telehealth: Payer: Self-pay | Admitting: Adult Health

## 2019-09-13 DIAGNOSIS — I10 Essential (primary) hypertension: Secondary | ICD-10-CM

## 2019-09-13 MED ORDER — LOSARTAN POTASSIUM-HCTZ 100-25 MG PO TABS: 1.0000 | ORAL_TABLET | Freq: Every day | ORAL | 0 refills | Status: DC

## 2019-09-13 NOTE — Telephone Encounter (Signed)
yes

## 2019-09-13 NOTE — Telephone Encounter (Signed)
Sent to the pharmacy by e-scribe. 

## 2019-09-13 NOTE — Telephone Encounter (Signed)
rx refill losartan-hydrochlorothiazide (HYZAAR) 100-25 MG tablet  PHARMACY [facility] [phone] - [place], [place] #:  [id number]

## 2019-09-13 NOTE — Telephone Encounter (Signed)
Last CPX 11/2017.  I have her scheduled for BP follow up and med refill.  Can we send in 30 day supply?  She will also need to be scheduled for cpx.

## 2019-09-26 ENCOUNTER — Encounter: Payer: Self-pay | Admitting: Obstetrics & Gynecology

## 2019-09-26 ENCOUNTER — Ambulatory Visit (INDEPENDENT_AMBULATORY_CARE_PROVIDER_SITE_OTHER): Payer: Self-pay | Admitting: Obstetrics & Gynecology

## 2019-09-26 ENCOUNTER — Other Ambulatory Visit: Payer: Self-pay

## 2019-09-26 VITALS — BP 126/82

## 2019-09-26 DIAGNOSIS — M791 Myalgia, unspecified site: Secondary | ICD-10-CM

## 2019-09-26 MED ORDER — DICLOFENAC SODIUM 1 % EX GEL: 2.0000 g | Freq: Four times a day (QID) | CUTANEOUS | 1 refills | Status: AC

## 2019-09-26 NOTE — Progress Notes (Signed)
    Shannon [name] [birth year] [id number]        68 y.o.  [id number] Married  RP: Tenderness at Rt buttock close to vulva x 5 days  HPI: Tenderness at Rt buttock close to posterior vulva when woke up 5 days ago.  Sexually active, but it was not during or after IC.  Sitting a lot at work.  No skin lesion.  No bump felt.  No recent episode of genital herpes.   OB History  Gravida Para Term Preterm AB Living  3 2     1 2   SAB TAB Ectopic Multiple Live Births               # Outcome Date GA Lbr Len/2nd Weight Sex Delivery Anes PTL Lv  3 AB           2 Para           1 Para             Past medical history,surgical history, problem list, medications, allergies, family history and social history were all reviewed and documented in the EPIC chart.   Directed ROS with pertinent positives and negatives documented in the history of present illness/assessment and plan.  Exam:  Vitals:   09/26/19 1403  BP: 126/82   General appearance:  Normal  Gynecologic exam: Vulva normal.  Vaginal secretions normal.  Rt posterior vulva/buttock mildly tender when touching the muscle, but no swelling, no lump, no erythema, no lesion at the skin.   Assessment/Plan:  68 y.o. [id number]   1. Muscle soreness Right buttock towards the right posterior vulva muscular soreness.  No evidence of trauma, no swelling, no erythema and no skin lesion.  Recommend muscular rest and avoiding sitting on the tender area.  Will apply Voltaren gel as needed for the coming week.  Usage reviewed.  Prescription sent to pharmacy.  Other orders - diclofenac Sodium (VOLTAREN) 1 % GEL; Apply 2 g topically 4 (four) times daily for 7 days.  Counseling on above issues and coordination of care more than 50% for 15 minutes.  Princess Bruins MD, 2:30 PM 09/26/2019

## 2019-09-26 NOTE — Patient Instructions (Signed)
1. Muscle soreness Right buttock towards the right posterior vulva muscular soreness.  No evidence of trauma, no swelling, no erythema and no skin lesion.  Recommend muscular rest and avoiding sitting on the tender area.  Will apply Voltaren gel as needed for the coming week.  Usage reviewed.  Prescription sent to pharmacy.  Other orders - diclofenac Sodium (VOLTAREN) 1 % GEL; Apply 2 g topically 4 (four) times daily for 7 days.  Kayela, it was a pleasure seeing you today!

## 2019-10-03 ENCOUNTER — Ambulatory Visit (INDEPENDENT_AMBULATORY_CARE_PROVIDER_SITE_OTHER): Payer: Self-pay | Admitting: Adult Health

## 2019-10-03 ENCOUNTER — Encounter: Payer: Self-pay | Admitting: Adult Health

## 2019-10-03 ENCOUNTER — Other Ambulatory Visit: Payer: Self-pay

## 2019-10-03 VITALS — BP 140/84 | Temp 97.2°F | Wt 157.0 lb

## 2019-10-03 DIAGNOSIS — G4700 Insomnia, unspecified: Secondary | ICD-10-CM

## 2019-10-03 DIAGNOSIS — Z1211 Encounter for screening for malignant neoplasm of colon: Secondary | ICD-10-CM

## 2019-10-03 DIAGNOSIS — Z0000 Encounter for general adult medical examination without abnormal findings: Secondary | ICD-10-CM

## 2019-10-03 DIAGNOSIS — M8589 Other specified disorders of bone density and structure, multiple sites: Secondary | ICD-10-CM

## 2019-10-03 DIAGNOSIS — I10 Essential (primary) hypertension: Secondary | ICD-10-CM

## 2019-10-03 DIAGNOSIS — Z23 Encounter for immunization: Secondary | ICD-10-CM

## 2019-10-03 LAB — CBC WITH DIFFERENTIAL/PLATELET
Basophils Absolute: 0 10*3/uL (ref 0.0–0.1)
Basophils Relative: 0.6 % (ref 0.0–3.0)
Eosinophils Absolute: 0.1 10*3/uL (ref 0.0–0.7)
Eosinophils Relative: 1 % (ref 0.0–5.0)
HCT: 38.9 % (ref 36.0–46.0)
Hemoglobin: 12.8 g/dL (ref 12.0–15.0)
Lymphocytes Relative: 24.9 % (ref 12.0–46.0)
Lymphs Abs: 1.6 10*3/uL (ref 0.7–4.0)
MCHC: 32.9 g/dL (ref 30.0–36.0)
MCV: 95.7 fl (ref 78.0–100.0)
Monocytes Absolute: 0.5 10*3/uL (ref 0.1–1.0)
Monocytes Relative: 6.8 % (ref 3.0–12.0)
Neutro Abs: 4.4 10*3/uL (ref 1.4–7.7)
Neutrophils Relative %: 66.7 % (ref 43.0–77.0)
Platelets: 437 10*3/uL — ABNORMAL HIGH (ref 150.0–400.0)
RBC: 4.07 Mil/uL (ref 3.87–5.11)
RDW: 13.6 % (ref 11.5–15.5)
WBC: 6.6 10*3/uL (ref 4.0–10.5)

## 2019-10-03 LAB — COMPREHENSIVE METABOLIC PANEL
ALT: 13 U/L (ref 0–35)
AST: 15 U/L (ref 0–37)
Albumin: 4 g/dL (ref 3.5–5.2)
Alkaline Phosphatase: 85 U/L (ref 39–117)
BUN: 13 mg/dL (ref 6–23)
CO2: 29 mEq/L (ref 19–32)
Calcium: 10.7 mg/dL — ABNORMAL HIGH (ref 8.4–10.5)
Chloride: 102 mEq/L (ref 96–112)
Creatinine, Ser: 0.79 mg/dL (ref 0.40–1.20)
GFR: 87.84 mL/min (ref >60.00)
Glucose, Bld: 93 mg/dL (ref 70–99)
Potassium: 4.1 mEq/L (ref 3.5–5.1)
Sodium: 138 mEq/L (ref 135–145)
Total Bilirubin: 0.4 mg/dL (ref 0.2–1.2)
Total Protein: 7.2 g/dL (ref 6.0–8.3)

## 2019-10-03 LAB — TSH: TSH: 1.56 u[IU]/mL (ref 0.35–4.50)

## 2019-10-03 LAB — LIPID PANEL
Cholesterol: 183 mg/dL (ref 0–200)
HDL: 88.4 mg/dL (ref >39.00)
LDL Cholesterol: 83 mg/dL (ref 0–99)
NonHDL: 94.56
Total CHOL/HDL Ratio: 2
Triglycerides: 60 mg/dL (ref 0.0–149.0)
VLDL: 12 mg/dL (ref 0.0–40.0)

## 2019-10-03 LAB — VITAMIN D 25 HYDROXY (VIT D DEFICIENCY, FRACTURES): VITD: 27.26 ng/mL — ABNORMAL LOW (ref 30.00–100.00)

## 2019-10-03 MED ORDER — BYSTOLIC 20 MG PO TABS: ORAL_TABLET | ORAL | 3 refills | Status: DC

## 2019-10-03 MED ORDER — LOSARTAN POTASSIUM-HCTZ 100-25 MG PO TABS: 1.0000 | ORAL_TABLET | Freq: Every day | ORAL | 3 refills | Status: DC

## 2019-10-03 NOTE — Progress Notes (Signed)
Subjective:    Patient ID: Shannon [name], female    DOB: 02-05-1951, 68 y.o.   MRN: [id number]  HPI Patient presents for yearly preventative medicine examination. She is a pleasant 68 year old female who  has a past medical history of Allergy, Anemia, Bronchitis, GERD (gastroesophageal reflux disease), Headache(784.0), Hypertension, and Osteopenia.  Hypertension-is currently prescribed Hyzaar 123456 and Bystolic 20 mg.  She denies dizziness, lightheadedness, chest pain, shortness of breath, or syncopal episodes BP Readings from Last 3 Encounters:  10/03/19 140/84  09/26/19 126/82  02/21/19 134/78   Osteopenia -osteopenia on bone density in May 2018.  She was supposed to have another bone density screen done in June 2020 but this has not been scheduled yet.  Insomnia-takes Ambien 5 mg as needed  All immunizations and health maintenance protocols were reviewed with the patient and needed orders were placed.  She is due for her second pneumonia vaccination  Appropriate screening laboratory values were ordered for the patient including screening of hyperlipidemia, renal function and hepatic function.  Medication reconciliation,  past medical history, social history, problem list and allergies were reviewed in detail with the patient  Goals were established with regard to weight loss, exercise, and  diet in compliance with medications. She is eating healthy and staying active but has not been able to swim do to the gyms being closed.  Wt Readings from Last 3 Encounters:  10/03/19 157 lb (71.2 kg)  02/21/19 145 lb (65.8 kg)  03/30/18 128 lb (58.1 kg)   End of life planning was discussed.  She is followed by GYN had a normal gynecologic exam in April 2020.  She has her mammogram in January. She is due for colonoscopy as well   Review of Systems  Constitutional: Negative.   HENT: Negative.   Eyes: Negative.   Respiratory: Negative.   Cardiovascular: Negative.   Gastrointestinal:  Negative.   Endocrine: Negative.   Genitourinary: Negative.   Musculoskeletal: Negative.   Skin: Negative.   Allergic/Immunologic: Negative.   Neurological: Negative.   Hematological: Negative.   Psychiatric/Behavioral: Negative.    Past Medical History:  Diagnosis Date  . Allergy   . Anemia   . Bronchitis    in past  . GERD (gastroesophageal reflux disease)   . Headache(784.0)   . Hypertension   . Osteopenia     Social History   Socioeconomic History  . Marital status: Married    Spouse name: Not on file  . Number of children: Not on file  . Years of education: Not on file  . Highest education level: Not on file  Occupational History  . Not on file  Social Needs  . Financial resource strain: Not on file  . Food insecurity    Worry: Not on file    Inability: Not on file  . Transportation needs    Medical: Not on file    Non-medical: Not on file  Tobacco Use  . Smoking status: Former Smoker    Quit date: 01/31/2019    Years since quitting: 0.6  . Smokeless tobacco: Never Used  Substance and Sexual Activity  . Alcohol use: Yes    Alcohol/week: 5.0 standard drinks    Types: 5 Glasses of wine per week  . Drug use: Not Currently    Types: Marijuana  . Sexual activity: Yes  Lifestyle  . Physical activity    Days per week: Not on file    Minutes per session: Not on file  .  Stress: Not on file  Relationships  . Social Herbalist on phone: Not on file    Gets together: Not on file    Attends religious service: Not on file    Active member of club or organization: Not on file    Attends meetings of clubs or organizations: Not on file    Relationship status: Not on file  . Intimate partner violence    Fear of current or ex partner: Not on file    Emotionally abused: Not on file    Physically abused: Not on file    Forced sexual activity: Not on file  Other Topics Concern  . 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 BREAST LUMPECTOMY     left breast /benign  . TUBAL LIGATION      Family History  Problem Relation Age of Onset  . Hypertension Mother        age 57  . Alzheimer's disease Father        deceased secondary to alzheimer's disease    Allergies  Allergen Reactions  . Penicillins Shortness Of Breath  . Corn-Containing Products Itching    Current Outpatient Medications on File Prior to Visit  Medication Sig Dispense Refill  . Ascorbic Acid (VITAMIN C) 1000 MG tablet Take 1,000 mg by mouth daily.    Marland Kitchen BYSTOLIC 20 MG TABS TAKE 1 TABLET(20 MG) BY MOUTH DAILY 90 tablet 1  . diclofenac Sodium (VOLTAREN) 1 % GEL Apply 2 g topically 4 (four) times daily for 7 days. 50 g 1  . fluticasone (FLONASE) 50 MCG/ACT nasal spray Place 2 sprays into both nostrils daily. 16 g 3  . losartan-hydrochlorothiazide (HYZAAR) 100-25 MG tablet Take 1 tablet by mouth daily. 30 tablet 0  . valACYclovir (VALTREX) 500 MG tablet Take 1 tablet (500 mg total) by mouth daily. Prophylaxis 90 tablet 4   No current facility-administered medications on file prior to visit.     BP 140/84   Temp (!) 97.2 F (36.2 C) (Temporal)   Wt 157 lb (71.2 kg)   BMI 26.95 kg/m       Objective:   Physical Exam Vitals signs and nursing note reviewed.  Constitutional:      General: She is not in acute distress.    Appearance: She is well-developed.  HENT:     Head: Normocephalic and atraumatic.     Right Ear: Tympanic membrane, ear canal and external ear normal. There is no impacted cerumen.     Left Ear: Tympanic membrane, ear canal and external ear normal. There is no impacted cerumen.     Nose: Nose normal. No congestion or rhinorrhea.     Mouth/Throat:     Mouth: Mucous membranes are moist.     Pharynx: Oropharynx is clear. No  oropharyngeal exudate or posterior oropharyngeal erythema.  Eyes:     General:        Right eye: No discharge.        Left eye: No discharge.     Conjunctiva/sclera: Conjunctivae normal.  Neck:     Musculoskeletal: Normal range of motion and neck supple.  Thyroid: No thyromegaly.     Trachea: No tracheal deviation.  Cardiovascular:     Rate and Rhythm: Normal rate and regular rhythm.     Pulses: Normal pulses.     Heart sounds: Normal heart sounds. No murmur. No friction rub. No gallop.   Pulmonary:     Effort: Pulmonary effort is normal. No respiratory distress.     Breath sounds: Normal breath sounds. No stridor. No wheezing, rhonchi or rales.  Chest:     Chest wall: No tenderness.  Abdominal:     General: Bowel sounds are normal. There is no distension.     Palpations: Abdomen is soft. There is no mass.     Tenderness: There is no abdominal tenderness. There is no right CVA tenderness, left CVA tenderness, guarding or rebound.     Hernia: No hernia is present.  Musculoskeletal: Normal range of motion.        General: No swelling, tenderness, deformity or signs of injury.     Right lower leg: No edema.     Left lower leg: No edema.  Lymphadenopathy:     Cervical: No cervical adenopathy.  Skin:    General: Skin is warm and dry.     Coloration: Skin is not jaundiced or pale.     Findings: No bruising, erythema, lesion or rash.  Neurological:     General: No focal deficit present.     Mental Status: She is alert and oriented to person, place, and time. Mental status is at baseline.     Cranial Nerves: No cranial nerve deficit.     Coordination: Coordination normal.  Psychiatric:        Mood and Affect: Mood normal.        Behavior: Behavior normal.        Thought Content: Thought content normal.        Judgment: Judgment normal.       Assessment & Plan:  1. Routine general medical examination at a health care facility - Follow up in one year or sooner if needed -  Continue with diet and exericse  - CBC with Differential/Platelet - CMP - Lipid panel - TSH  2. Essential hypertension - No change in medication at this time  - CBC with Differential/Platelet - CMP - Lipid panel - TSH - losartan-hydrochlorothiazide (HYZAAR) 100-25 MG tablet; Take 1 tablet by mouth daily.  Dispense: 90 tablet; Refill: 3 - Nebivolol HCl (BYSTOLIC) 20 MG TABS; TAKE 1 TABLET(20 MG) BY MOUTH DAILY  Dispense: 90 tablet; Refill: 3  3. Colon cancer screening  - Screening Colonoscopy  4. Osteopenia of multiple sites - Continue vitamin D and bone density screen  - CBC with Differential/Platelet - CMP - Lipid panel - TSH - Vitamin D, 25-hydroxy  5. Insomnia, unspecified type - Continue Ambien 5 mg   Dorothyann Peng, NP

## 2019-10-04 ENCOUNTER — Other Ambulatory Visit: Payer: Self-pay | Admitting: Adult Health

## 2019-10-04 DIAGNOSIS — E559 Vitamin D deficiency, unspecified: Secondary | ICD-10-CM

## 2019-10-04 NOTE — Progress Notes (Signed)
PTH

## 2019-11-02 ENCOUNTER — Encounter: Payer: Self-pay | Admitting: Gastroenterology

## 2019-11-22 ENCOUNTER — Telehealth: Payer: Self-pay | Admitting: *Deleted

## 2019-11-22 NOTE — Telephone Encounter (Signed)
Patient no show pv today. Called patient, no answer, left message for the patient to call us back to reschedule the nurse visit before 5 pm today or the colonoscopy would be cancelled.

## 2019-11-29 ENCOUNTER — Telehealth: Payer: Self-pay | Admitting: Adult Health

## 2019-11-29 ENCOUNTER — Other Ambulatory Visit: Payer: Self-pay

## 2019-11-29 ENCOUNTER — Ambulatory Visit (INDEPENDENT_AMBULATORY_CARE_PROVIDER_SITE_OTHER): Payer: Self-pay

## 2019-11-29 DIAGNOSIS — Z111 Encounter for screening for respiratory tuberculosis: Secondary | ICD-10-CM

## 2019-11-29 NOTE — Progress Notes (Signed)
Per orders of [employer], injection of PPD given by Franco Collet. Patient tolerated injection well.

## 2019-11-29 NOTE — Telephone Encounter (Signed)
Appointment scheduled for 2PM  today. Informed patient she will need to return on Friday for TB reading. Patient verbalized understanding. Patient screening already done.

## 2019-11-29 NOTE — Telephone Encounter (Signed)
Patient called and would like to know if she can have a TB skin test done. She needs it for her job. Please call patient back, thanks.

## 2019-12-04 ENCOUNTER — Ambulatory Visit: Payer: Self-pay

## 2019-12-04 ENCOUNTER — Other Ambulatory Visit: Payer: Self-pay

## 2019-12-05 ENCOUNTER — Ambulatory Visit (INDEPENDENT_AMBULATORY_CARE_PROVIDER_SITE_OTHER): Payer: Self-pay | Admitting: *Deleted

## 2019-12-05 ENCOUNTER — Encounter: Payer: Self-pay | Admitting: Gastroenterology

## 2019-12-05 ENCOUNTER — Other Ambulatory Visit: Payer: Self-pay

## 2019-12-05 DIAGNOSIS — Z111 Encounter for screening for respiratory tuberculosis: Secondary | ICD-10-CM

## 2019-12-05 NOTE — Progress Notes (Signed)
Per orders of Dr. Dorothyann Peng, injection of PPD given by Zacarias Pontes. Patient tolerated injection well.

## 2019-12-06 ENCOUNTER — Ambulatory Visit: Payer: Self-pay

## 2019-12-07 LAB — TB SKIN TEST
Induration: 0 mm
TB Skin Test: NEGATIVE

## 2019-12-07 NOTE — Progress Notes (Addendum)
Patientt present 12/06/2018 for PPD reading. PPD reading was negative with 0 induration.

## 2020-01-01 ENCOUNTER — Other Ambulatory Visit: Payer: Self-pay

## 2020-09-24 ENCOUNTER — Telehealth: Payer: Self-pay | Admitting: Adult Health

## 2020-09-24 NOTE — Telephone Encounter (Signed)
No longer needed

## 2020-09-25 ENCOUNTER — Telehealth (INDEPENDENT_AMBULATORY_CARE_PROVIDER_SITE_OTHER): Payer: Self-pay | Admitting: Adult Health

## 2020-09-25 ENCOUNTER — Other Ambulatory Visit: Payer: Self-pay

## 2020-09-25 DIAGNOSIS — K922 Gastrointestinal hemorrhage, unspecified: Secondary | ICD-10-CM

## 2020-09-25 MED ORDER — PANTOPRAZOLE SODIUM 40 MG PO TBEC: 40.0000 mg | DELAYED_RELEASE_TABLET | Freq: Every day | ORAL | 0 refills | Status: DC

## 2020-09-25 NOTE — Progress Notes (Signed)
Virtual Visit via Video Note  I connected with Shannon [name]  on 09/25/20 at  8:00 AM EST by a video enabled telemedicine application and verified that I am speaking with the correct person using two identifiers.  Location patient: home Location provider:work or home office Persons participating in the virtual visit: patient, provider  I discussed the limitations of evaluation and management by telemedicine and the availability of in person appointments. The patient expressed understanding and agreed to proceed.   HPI:  69 year old female who is being evaluated today for an acute issue that started roughly 5 days ago.  Her symptoms started with indigestion, bloating, increased gas, and diarrhea.  She then started noticing that after she eats she would have diarrhea.  Reports that the diarrhea has dark and tarry first thing in the morning and then starts to turn brown throughout the day.  She does report abdominal cramping but denies nausea, vomiting, or fevers.  She does not have an appetite and she has not been eating much over the last few days.  She has not been using any anti-inflammatory medication has not use any over-the-counter medication for her symptoms  ROS: See pertinent positives and negatives per HPI.  Past Medical History:  Diagnosis Date   Allergy    Anemia    Bronchitis    in past   GERD (gastroesophageal reflux disease)    Headache(784.0)    Hypertension    Osteopenia     Past Surgical History:  Procedure Laterality Date   BREAST LUMPECTOMY     left breast /benign   TUBAL LIGATION      Family History  Problem Relation Age of Onset   Hypertension Mother        age 64   Alzheimer's disease Father        deceased secondary to alzheimer's disease       Current Outpatient Medications:    Ascorbic Acid (VITAMIN C) 1000 MG tablet, Take 1,000 mg by mouth daily., Disp: , Rfl:    fluticasone (FLONASE) 50 MCG/ACT nasal spray, Place 2 sprays into  both nostrils daily., Disp: 16 g, Rfl: 3   losartan-hydrochlorothiazide (HYZAAR) 100-25 MG tablet, Take 1 tablet by mouth daily., Disp: 90 tablet, Rfl: 3   Nebivolol HCl (BYSTOLIC) 20 MG TABS, TAKE 1 TABLET(20 MG) BY MOUTH DAILY, Disp: 90 tablet, Rfl: 3   valACYclovir (VALTREX) 500 MG tablet, Take 1 tablet (500 mg total) by mouth daily. Prophylaxis, Disp: 90 tablet, Rfl: 4  EXAM:  VITALS per patient if applicable:  GENERAL: alert, oriented, appears well and in no acute distress  HEENT: atraumatic, conjunttiva clear, no obvious abnormalities on inspection of external nose and ears  NECK: normal movements of the head and neck  LUNGS: on inspection no signs of respiratory distress, breathing rate appears normal, no obvious gross SOB, gasping or wheezing  CV: no obvious cyanosis  MS: moves all visible extremities without noticeable abnormality  PSYCH/NEURO: pleasant and cooperative, no obvious depression or anxiety, speech and thought processing grossly intact  ASSESSMENT AND PLAN:  Discussed the following assessment and plan:  1. Acute upper GI bleeding -We will start her on Protonix 40 mg daily and refer to gastroenterology for questionable upper GI bleed.  She was advised to follow-up if symptoms worsen over the next 2 to 3 days - pantoprazole (PROTONIX) 40 MG tablet; Take 1 tablet (40 mg total) by mouth daily.  Dispense: 90 tablet; Refill: 0 - Ambulatory referral to Gastroenterology  I discussed the assessment and treatment plan with the patient. The patient was provided an opportunity to ask questions and all were answered. The patient agreed with the plan and demonstrated an understanding of the instructions.   The patient was advised to call back or seek an in-person evaluation if the symptoms worsen or if the condition fails to improve as anticipated.   Dorothyann Peng, NP

## 2020-10-17 ENCOUNTER — Telehealth: Payer: Self-pay | Admitting: Adult Health

## 2020-10-17 NOTE — Telephone Encounter (Signed)
Patient needs a refill on Nebivolol 20 mg tablets.  Patient prefers 90 day refill.    Pharmacy- [facility] on [address].

## 2020-10-21 ENCOUNTER — Other Ambulatory Visit: Payer: Self-pay | Admitting: Adult Health

## 2020-10-21 DIAGNOSIS — I10 Essential (primary) hypertension: Secondary | ICD-10-CM

## 2020-11-19 ENCOUNTER — Encounter: Payer: Self-pay | Admitting: Physician Assistant

## 2020-11-21 ENCOUNTER — Encounter: Payer: Self-pay | Admitting: Obstetrics & Gynecology

## 2020-12-11 ENCOUNTER — Ambulatory Visit: Payer: Self-pay | Admitting: Physician Assistant

## 2020-12-26 ENCOUNTER — Other Ambulatory Visit: Payer: Self-pay | Admitting: Adult Health

## 2020-12-26 DIAGNOSIS — I10 Essential (primary) hypertension: Secondary | ICD-10-CM

## 2020-12-27 NOTE — Telephone Encounter (Signed)
Denied.  Pt is past due for cpx.

## 2020-12-30 ENCOUNTER — Other Ambulatory Visit: Payer: Self-pay | Admitting: Adult Health

## 2020-12-30 DIAGNOSIS — I10 Essential (primary) hypertension: Secondary | ICD-10-CM

## 2021-01-01 NOTE — Telephone Encounter (Signed)
DENIED.  NEEDS CPX. 

## 2021-01-09 ENCOUNTER — Other Ambulatory Visit: Payer: Self-pay | Admitting: Adult Health

## 2021-01-09 DIAGNOSIS — I10 Essential (primary) hypertension: Secondary | ICD-10-CM

## 2021-04-09 ENCOUNTER — Ambulatory Visit (INDEPENDENT_AMBULATORY_CARE_PROVIDER_SITE_OTHER): Payer: Self-pay | Admitting: Adult Health

## 2021-04-09 ENCOUNTER — Encounter: Payer: Self-pay | Admitting: Adult Health

## 2021-04-09 ENCOUNTER — Other Ambulatory Visit: Payer: Self-pay

## 2021-04-09 VITALS — BP 130/62 | HR 75 | Temp 98.6°F | Ht 64.0 in | Wt 139.0 lb

## 2021-04-09 DIAGNOSIS — M25561 Pain in right knee: Secondary | ICD-10-CM

## 2021-04-09 NOTE — Progress Notes (Signed)
Subjective:    Patient ID: Shannon [name], female    DOB: 08-20-1951, 70 y.o.   MRN: [id number]  HPI 70 year old female who  has a past medical history of Allergy, Anemia, Bronchitis, GERD (gastroesophageal reflux disease), Headache(784.0), Hypertension, and Osteopenia.  She presents to the office today for an acute issue of right knee pain. She reports that 2 days ago her foot got stuck and she was walking downstairs and fell down 2 steps onto her right knee.  Her right knee was swollen and painful but she was able to ambulate on it.  Over the last 2 days the swelling has improved as well as the pain but she does continue to have pain especially with ambulation.   Review of Systems See HPI   Past Medical History:  Diagnosis Date  . Allergy   . Anemia   . Bronchitis    in past  . GERD (gastroesophageal reflux disease)   . Headache(784.0)   . Hypertension   . Osteopenia     Social History   Socioeconomic History  . Marital status: Married    Spouse name: Not on file  . Number of children: Not on file  . Years of education: Not on file  . Highest education level: Not on file  Occupational History  . Not on file  Tobacco Use  . Smoking status: Former Smoker    Quit date: 01/31/2019    Years since quitting: 2.1  . Smokeless tobacco: Never Used  Vaping Use  . Vaping Use: Never used  Substance and Sexual Activity  . Alcohol use: Yes    Alcohol/week: 5.0 standard drinks    Types: 5 Glasses of wine per week  . Drug use: Not Currently    Types: Marijuana  . Sexual activity: Yes  Other Topics Concern  . 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 BREAST LUMPECTOMY     left breast /benign  . TUBAL LIGATION      Family History  Problem Relation Age of Onset  . Hypertension Mother        age 37  . Alzheimer's disease Father        deceased secondary to alzheimer's disease    Allergies  Allergen Reactions  . Penicillins Shortness Of Breath  . Corn-Containing Products Itching    Current Outpatient Medications on File Prior to Visit  Medication Sig Dispense Refill  . Ascorbic Acid (VITAMIN C) 1000 MG tablet Take 1,000 mg by mouth daily.    . fluticasone (FLONASE) 50 MCG/ACT nasal spray Place 2 sprays into both nostrils daily. 16 g 3  . losartan-hydrochlorothiazide (HYZAAR) 100-25 MG tablet Take 1 tablet by mouth daily. Need physical exam for further refills 90 tablet 0  . Nebivolol HCl 20 MG TABS TAKE 1 TABLET(20 MG) BY MOUTH DAILY 90 tablet 3  . pantoprazole (  PROTONIX) 40 MG tablet Take 1 tablet (40 mg total) by mouth daily. 90 tablet 0  . valACYclovir (VALTREX) 500 MG tablet Take 1 tablet (500 mg total) by mouth daily. Prophylaxis 90 tablet 4   No current facility-administered medications on file prior to visit.    BP 130/62   Pulse 75   Temp 98.6 F (37 C) (Oral)   Ht 5\' 4"  (1.626 m)   Wt 139 lb (63 kg)   SpO2 99%   BMI 23.86 kg/m       Objective:   Physical Exam Vitals and nursing note reviewed.  Constitutional:      Appearance: Normal appearance.  Musculoskeletal:     Right knee: Swelling, deformity and bony tenderness present. No crepitus. Normal range of motion. Tenderness present over the MCL. No LCL, ACL, PCL or patellar tendon tenderness. No LCL laxity, MCL laxity, ACL laxity or PCL laxity. Normal alignment. Normal pulse.     Instability Tests: Anterior drawer test negative. Posterior drawer test negative. Anterior Lachman test negative. Medial  McMurray test negative and lateral McMurray test negative.     Left knee: Normal.     Comments: Mild soft tissue swelling to medial aspect of right knee    Neurological:     Mental Status: She is alert.       Assessment & Plan:  1. Acute pain of right knee -Seems to be more soft tissue injury but she does have some mild bony tenderness on the medial aspect of her right knee.  We will get x-ray to rule out skeletal abnormality.  Can consider MRI in the future if symptoms do not resolve in the next week - DG Knee 1-2 Views Right; Future   Dorothyann Peng, NP

## 2021-04-10 ENCOUNTER — Ambulatory Visit (INDEPENDENT_AMBULATORY_CARE_PROVIDER_SITE_OTHER)
Admission: RE | Admit: 2021-04-10 | Discharge: 2021-04-10 | Disposition: A | Discharge status: Home / Self Care | Payer: Self-pay | Source: Ambulatory Visit | Attending: Adult Health | Admitting: Adult Health

## 2021-04-10 DIAGNOSIS — M25561 Pain in right knee: Secondary | ICD-10-CM

## 2021-05-31 ENCOUNTER — Other Ambulatory Visit: Payer: Self-pay | Admitting: Adult Health

## 2021-05-31 DIAGNOSIS — I10 Essential (primary) hypertension: Secondary | ICD-10-CM

## 2021-06-05 ENCOUNTER — Encounter: Payer: Self-pay | Admitting: Obstetrics & Gynecology

## 2021-07-01 ENCOUNTER — Encounter: Payer: Self-pay | Admitting: Obstetrics & Gynecology

## 2021-09-09 ENCOUNTER — Other Ambulatory Visit: Payer: Self-pay | Admitting: Adult Health

## 2021-09-09 DIAGNOSIS — I10 Essential (primary) hypertension: Secondary | ICD-10-CM

## 2021-09-18 ENCOUNTER — Ambulatory Visit (INDEPENDENT_AMBULATORY_CARE_PROVIDER_SITE_OTHER): Payer: Medicare Other | Admitting: Adult Health

## 2021-09-18 ENCOUNTER — Encounter: Payer: Self-pay | Admitting: Adult Health

## 2021-09-18 VITALS — BP 120/70 | HR 65 | Temp 98.0°F | Ht 64.0 in | Wt 145.0 lb

## 2021-09-18 DIAGNOSIS — M8580 Other specified disorders of bone density and structure, unspecified site: Secondary | ICD-10-CM | POA: Diagnosis not present

## 2021-09-18 DIAGNOSIS — Z23 Encounter for immunization: Secondary | ICD-10-CM

## 2021-09-18 DIAGNOSIS — Z1211 Encounter for screening for malignant neoplasm of colon: Secondary | ICD-10-CM | POA: Diagnosis not present

## 2021-09-18 DIAGNOSIS — I10 Essential (primary) hypertension: Secondary | ICD-10-CM | POA: Diagnosis not present

## 2021-09-18 DIAGNOSIS — G4700 Insomnia, unspecified: Secondary | ICD-10-CM

## 2021-09-18 DIAGNOSIS — Z780 Asymptomatic menopausal state: Secondary | ICD-10-CM

## 2021-09-18 LAB — COMPREHENSIVE METABOLIC PANEL
ALT: 12 U/L (ref 0–35)
AST: 16 U/L (ref 0–37)
Albumin: 4.1 g/dL (ref 3.5–5.2)
Alkaline Phosphatase: 81 U/L (ref 39–117)
BUN: 8 mg/dL (ref 6–23)
CO2: 31 mEq/L (ref 19–32)
Calcium: 10.5 mg/dL (ref 8.4–10.5)
Chloride: 102 mEq/L (ref 96–112)
Creatinine, Ser: 0.87 mg/dL (ref 0.40–1.20)
GFR: 68.2 mL/min (ref >60.00)
Glucose, Bld: 87 mg/dL (ref 70–99)
Potassium: 3.9 mEq/L (ref 3.5–5.1)
Sodium: 137 mEq/L (ref 135–145)
Total Bilirubin: 0.6 mg/dL (ref 0.2–1.2)
Total Protein: 7.1 g/dL (ref 6.0–8.3)

## 2021-09-18 LAB — CBC WITH DIFFERENTIAL/PLATELET
Basophils Absolute: 0 10*3/uL (ref 0.0–0.1)
Basophils Relative: 0.8 % (ref 0.0–3.0)
Eosinophils Absolute: 0.1 10*3/uL (ref 0.0–0.7)
Eosinophils Relative: 2.7 % (ref 0.0–5.0)
HCT: 39.1 % (ref 36.0–46.0)
Hemoglobin: 12.9 g/dL (ref 12.0–15.0)
Lymphocytes Relative: 34.9 % (ref 12.0–46.0)
Lymphs Abs: 1.7 10*3/uL (ref 0.7–4.0)
MCHC: 33.1 g/dL (ref 30.0–36.0)
MCV: 96.9 fl (ref 78.0–100.0)
Monocytes Absolute: 0.4 10*3/uL (ref 0.1–1.0)
Monocytes Relative: 9.2 % (ref 3.0–12.0)
Neutro Abs: 2.6 10*3/uL (ref 1.4–7.7)
Neutrophils Relative %: 52.4 % (ref 43.0–77.0)
Platelets: 485 10*3/uL — ABNORMAL HIGH (ref 150.0–400.0)
RBC: 4.03 Mil/uL (ref 3.87–5.11)
RDW: 13.5 % (ref 11.5–15.5)
WBC: 4.9 10*3/uL (ref 4.0–10.5)

## 2021-09-18 LAB — TSH: TSH: 1.24 u[IU]/mL (ref 0.35–5.50)

## 2021-09-18 MED ORDER — ZOLPIDEM TARTRATE 5 MG PO TABS: 5.0000 mg | ORAL_TABLET | Freq: Every evening | ORAL | 2 refills | Status: DC | PRN

## 2021-09-18 MED ORDER — NEBIVOLOL HCL 20 MG PO TABS: ORAL_TABLET | ORAL | 3 refills | Status: DC

## 2021-09-18 NOTE — Progress Notes (Signed)
Subjective:    Patient ID: Shannon [name], female    DOB: Nov 15, 1950, 70 y.o.   MRN: [id number]  HPI Patient presents for yearly preventative medicine examination. She is a pleasant 70 year old female who  has a past medical history of Allergy, Anemia, Bronchitis, GERD (gastroesophageal reflux disease), Headache(784.0), Hypertension, and Osteopenia.  Hypertension-prescribed Hyzaar 979-89 mg and Bystolic 20 mg.  She denies dizziness, lightheadedness, chest pain, shortness of breath, or syncopal episodes BP Readings from Last 3 Encounters:  09/18/21 120/70  04/09/21 130/62  10/03/19 140/84   Osteopenia- last bone density in 2018.   Insomnia-takes Ambien 5 mg as needed. Needs refill    All immunizations and health maintenance protocols were reviewed with the patient and needed orders were placed.  Appropriate screening laboratory values were ordered for the patient including screening of hyperlipidemia, renal function and hepatic function.  Medication reconciliation,  past medical history, social history, problem list and allergies were reviewed in detail with the patient  Goals were established with regard to weight loss, exercise, and  diet in compliance with medications. She is walking periodically and is eating healthy.   Wt Readings from Last 3 Encounters:  09/18/21 145 lb (65.8 kg)  04/09/21 139 lb (63 kg)  10/03/19 157 lb (71.2 kg)   Followed by GYN, up-to-date on mammograms.  She is overdue for colonoscopy as her last was in 2006.   Review of Systems  Constitutional: Negative.   HENT: Negative.    Eyes: Negative.   Respiratory: Negative.    Cardiovascular: Negative.   Gastrointestinal: Negative.   Endocrine: Negative.   Genitourinary: Negative.   Musculoskeletal: Negative.   Skin: Negative.   Allergic/Immunologic: Negative.   Neurological: Negative.   Hematological: Negative.   Psychiatric/Behavioral: Negative.     Past Medical History:  Diagnosis Date    Allergy    Anemia    Bronchitis    in past   GERD (gastroesophageal reflux disease)    Headache(784.0)    Hypertension    Osteopenia     Social History   Socioeconomic History   Marital status: Married    Spouse name: Not on file   Number of children: Not on file   Years of education: Not on file   Highest education level: Not on file  Occupational History   Not on file  Tobacco Use   Smoking status: Former    Types: Cigarettes    Quit date: 01/31/2019    Years since quitting: 2.6   Smokeless tobacco: Never  Vaping Use   Vaping Use: Never used  Substance and Sexual Activity   Alcohol use: Yes    Alcohol/week: 5.0 standard drinks    Types: 5 Glasses of wine per week   Drug use: Not Currently    Types: Marijuana   Sexual activity: Yes  Other Topics Concern   Not on file  Social History Narrative   Married - Husband Juanda Crumble)   Never Smoked    Alcohol use-no      Occupation -  Biochemist, 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[place])           Social Determinants of Radio broadcast assistant Strain: Not on file  Food Insecurity: Not on file  Transportation Needs: Not on file  Physical Activity: Not on file  Stress: Not on file  Social Connections: Not on file  Intimate Partner Violence: Not on file    Past  Surgical History:  Procedure Laterality Date   BREAST LUMPECTOMY     left breast /benign   TUBAL LIGATION      Family History  Problem Relation Age of Onset   Hypertension Mother        age 69   Alzheimer's disease Father        deceased secondary to alzheimer's disease    Allergies  Allergen Reactions   Penicillins Shortness Of Breath   Corn-Containing Products Itching    Current Outpatient Medications on File Prior to Visit  Medication Sig Dispense Refill   Ascorbic Acid (VITAMIN C) 1000 MG tablet Take 1,000 mg by mouth daily.     fluticasone (FLONASE) 50 MCG/ACT nasal spray Place 2 sprays into both nostrils daily. 16 g 3    losartan-hydrochlorothiazide (HYZAAR) 100-25 MG tablet TAKE 1 TABLET BY MOUTH DAILY 90 tablet 0   Nebivolol HCl 20 MG TABS TAKE 1 TABLET(20 MG) BY MOUTH DAILY 90 tablet 3   pantoprazole (PROTONIX) 40 MG tablet Take 1 tablet (40 mg total) by mouth daily. 90 tablet 0   valACYclovir (VALTREX) 500 MG tablet Take 1 tablet (500 mg total) by mouth daily. Prophylaxis 90 tablet 4   No current facility-administered medications on file prior to visit.    BP 120/70   Pulse 65   Temp 98 F (36.7 C) (Oral)   Ht 5\' 4"  (1.626 m)   Wt 145 lb (65.8 kg)   SpO2 98%   BMI 24.89 kg/m        Objective:   Physical Exam Vitals and nursing note reviewed.  Constitutional:      General: She is not in acute distress.    Appearance: Normal appearance. She is well-developed. She is not ill-appearing.  HENT:     Head: Normocephalic and atraumatic.     Right Ear: Tympanic membrane, ear canal and external ear normal. There is no impacted cerumen.     Left Ear: Tympanic membrane, ear canal and external ear normal. There is no impacted cerumen.     Nose: Nose normal. No congestion or rhinorrhea.     Mouth/Throat:     Mouth: Mucous membranes are moist.     Pharynx: Oropharynx is clear. No oropharyngeal exudate or posterior oropharyngeal erythema.  Eyes:     General:        Right eye: No discharge.        Left eye: No discharge.     Extraocular Movements: Extraocular movements intact.     Conjunctiva/sclera: Conjunctivae normal.     Pupils: Pupils are equal, round, and reactive to light.  Neck:     Thyroid: No thyromegaly.     Vascular: No carotid bruit.     Trachea: No tracheal deviation.  Cardiovascular:     Rate and Rhythm: Normal rate and regular rhythm.     Pulses: Normal pulses.     Heart sounds: Normal heart sounds. No murmur heard.   No friction rub. No gallop.  Pulmonary:     Effort: Pulmonary effort is normal. No respiratory distress.     Breath sounds: Normal breath sounds. No stridor. No  wheezing, rhonchi or rales.  Chest:     Chest wall: No tenderness.  Abdominal:     General: Abdomen is flat. Bowel sounds are normal. There is no distension.     Palpations: Abdomen is soft. There is no mass.     Tenderness: There is no abdominal tenderness. There is no right CVA tenderness, left  CVA tenderness, guarding or rebound.     Hernia: No hernia is present.  Musculoskeletal:        General: No swelling, tenderness, deformity or signs of injury. Normal range of motion.     Cervical back: Normal range of motion and neck supple.     Right lower leg: No edema.     Left lower leg: No edema.  Lymphadenopathy:     Cervical: No cervical adenopathy.  Skin:    General: Skin is warm and dry.     Coloration: Skin is not jaundiced or pale.     Findings: No bruising, erythema, lesion or rash.  Neurological:     General: No focal deficit present.     Mental Status: She is alert and oriented to person, place, and time.     Cranial Nerves: No cranial nerve deficit.     Sensory: No sensory deficit.     Motor: No weakness.     Coordination: Coordination normal.     Gait: Gait normal.     Deep Tendon Reflexes: Reflexes normal.  Psychiatric:        Mood and Affect: Mood normal.        Behavior: Behavior normal.        Thought Content: Thought content normal.        Judgment: Judgment normal.      Assessment & Plan:  1. Essential hypertension - Well controlled. No change in medications  - Nebivolol HCl 20 MG TABS; TAKE 1 TABLET(20 MG) BY MOUTH DAILY  Dispense: 90 tablet; Refill: 3 - CBC with Differential/Platelet; Future - Comprehensive metabolic panel; Future - TSH; Future  2. Insomnia, unspecified type  - zolpidem (AMBIEN) 5 MG tablet; Take 1 tablet (5 mg total) by mouth at bedtime as needed for sleep.  Dispense: 30 tablet; Refill: 2  3. Colon cancer screening  - Ambulatory referral to Gastroenterology  4. Osteopenia after menopause  - DG Bone Density; Future  5. Need for  immunization against influenza  - Flu Vaccine QUAD 39mo+IM (Fluarix, Fluzone & Alfiuria Quad PF)   Dorothyann Peng, NP

## 2021-09-18 NOTE — Patient Instructions (Signed)
It was great seeing you today   We will follow up with you regarding your blood work   I will see you back in one year or sooner if needed 

## 2021-09-19 IMAGING — DX DG KNEE 1-2V*R*
2 series · 2 of 2 positions shown · non-contrast
Comparison: None.

CLINICAL DATA: Medial pain post fall

EXAM:
RIGHT KNEE - 1-2 VIEW

[knee ap]
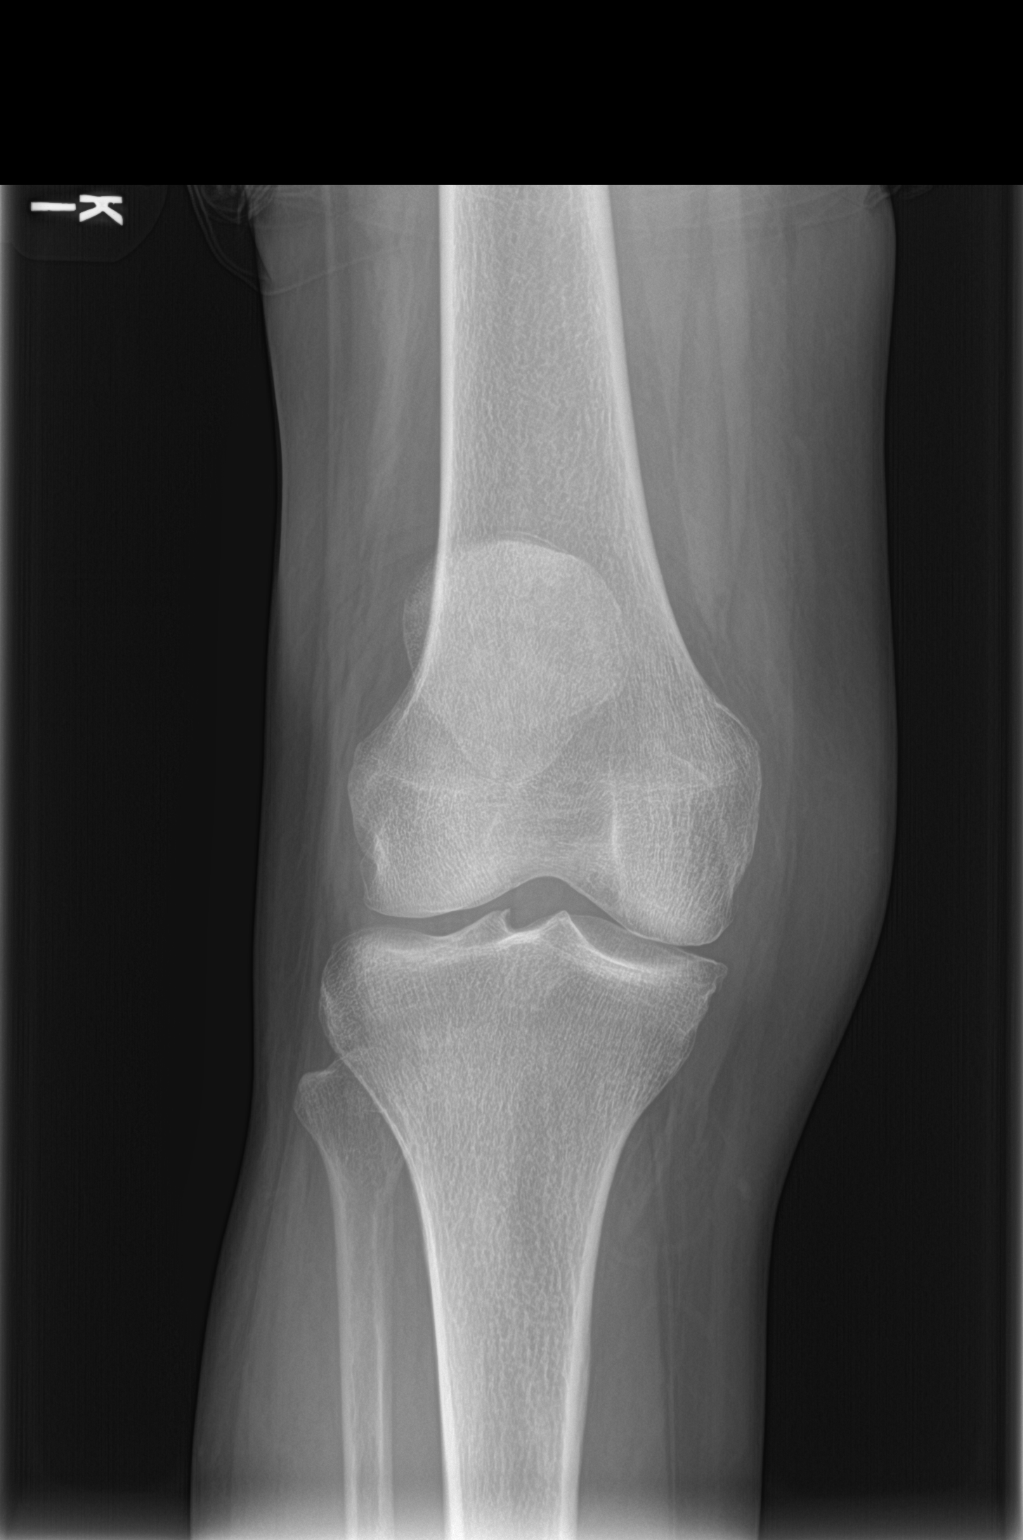

[knee lat]
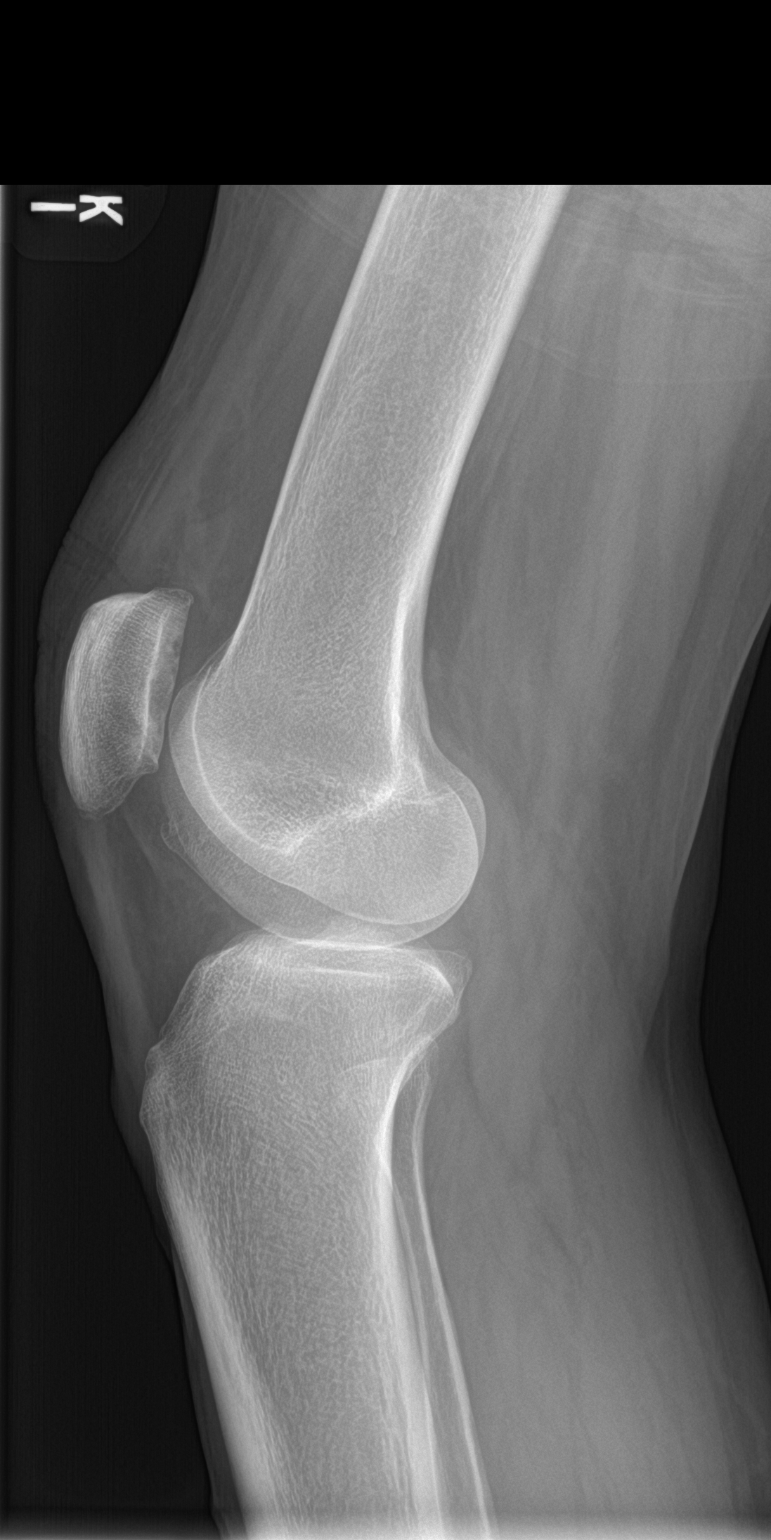

[2 of 2 positions shown; findings below may reference images not displayed]

FINDINGS: No evidence of fracture, dislocation, or joint effusion. No evidence
of arthropathy or other focal bone abnormality. Soft tissues are
unremarkable.
IMPRESSION: Negative.

## 2021-09-19 NOTE — Progress Notes (Signed)
Called pt unable to leave a message

## 2021-12-25 ENCOUNTER — Other Ambulatory Visit: Payer: Self-pay | Admitting: Adult Health

## 2021-12-25 ENCOUNTER — Ambulatory Visit (INDEPENDENT_AMBULATORY_CARE_PROVIDER_SITE_OTHER): Payer: Medicare Other | Admitting: Adult Health

## 2021-12-25 ENCOUNTER — Encounter: Payer: Self-pay | Admitting: Adult Health

## 2021-12-25 VITALS — BP 140/80 | HR 86 | Temp 98.5°F | Wt 140.0 lb

## 2021-12-25 DIAGNOSIS — R630 Anorexia: Secondary | ICD-10-CM

## 2021-12-25 DIAGNOSIS — R1013 Epigastric pain: Secondary | ICD-10-CM | POA: Diagnosis not present

## 2021-12-25 DIAGNOSIS — R197 Diarrhea, unspecified: Secondary | ICD-10-CM | POA: Diagnosis not present

## 2021-12-25 DIAGNOSIS — G4700 Insomnia, unspecified: Secondary | ICD-10-CM | POA: Diagnosis not present

## 2021-12-25 MED ORDER — ZOLPIDEM TARTRATE 5 MG PO TABS: 5.0000 mg | ORAL_TABLET | Freq: Every day | ORAL | 2 refills | Status: DC

## 2021-12-25 MED ORDER — OMEPRAZOLE 20 MG PO CPDR: 20.0000 mg | DELAYED_RELEASE_CAPSULE | Freq: Every day | ORAL | 0 refills | Status: DC

## 2021-12-25 NOTE — Progress Notes (Signed)
Subjective:    Patient ID: Shannon [name], female    DOB: August 26, 1951, 71 y.o.   MRN: [id number]  HPI  71 year old female who  has a past medical history of Allergy, Anemia, Bronchitis, GERD (gastroesophageal reflux disease), Headache(784.0), Hypertension, and Osteopenia.  She presents to the office today for abdominal pain/loss of appetite/diarrhea   She reports that over the last two -three weeks she has had loss of appetite throughout the day and every morning she wakes up with diarrhea. Over the last week she has been experiencing generalized abdominal pain/nausea    She reports being out of Ambien for the last three weeks as well.    Review of Systems See HPI   Past Medical History:  Diagnosis Date   Allergy    Anemia    Bronchitis    in past   GERD (gastroesophageal reflux disease)    Headache(784.0)    Hypertension    Osteopenia     Social History   Socioeconomic History   Marital status: Married    Spouse name: Not on file   Number of children: Not on file   Years of education: Not on file   Highest education level: Not on file  Occupational History   Not on file  Tobacco Use   Smoking status: Former    Types: Cigarettes    Quit date: 01/31/2019    Years since quitting: 2.9   Smokeless tobacco: Never  Vaping Use   Vaping Use: Never used  Substance and Sexual Activity   Alcohol use: Yes    Alcohol/week: 5.0 standard drinks    Types: 5 Glasses of wine per week   Drug use: Not Currently    Types: Marijuana   Sexual activity: Yes  Other Topics Concern   Not on file  Social History Narrative   Married - Husband Juanda Crumble)   Never Smoked    Alcohol use-no      Occupation -  Biochemist, 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[place])           Social Determinants of Radio broadcast assistant Strain: Not on file  Food Insecurity: Not on file  Transportation Needs: Not on file  Physical Activity: Not on file  Stress: Not on file  Social  Connections: Not on file  Intimate Partner Violence: Not on file    Past Surgical History:  Procedure Laterality Date   BREAST LUMPECTOMY     left breast /benign   TUBAL LIGATION      Family History  Problem Relation Age of Onset   Hypertension Mother        age 35   Alzheimer's disease Father        deceased secondary to alzheimer's disease    Allergies  Allergen Reactions   Penicillins Shortness Of Breath   Corn-Containing Products Itching    Current Outpatient Medications on File Prior to Visit  Medication Sig Dispense Refill   Ascorbic Acid (VITAMIN C) 1000 MG tablet Take 1,000 mg by mouth daily.     fluticasone (FLONASE) 50 MCG/ACT nasal spray Place 2 sprays into both nostrils daily. 16 g 3   losartan-hydrochlorothiazide (HYZAAR) 100-25 MG tablet TAKE 1 TABLET BY MOUTH DAILY 90 tablet 0   Nebivolol HCl 20 MG TABS TAKE 1 TABLET(20 MG) BY MOUTH DAILY 90 tablet 3   valACYclovir (VALTREX) 500 MG tablet Take 1 tablet (500 mg total) by mouth daily. Prophylaxis  90 tablet 4   No current facility-administered medications on file prior to visit.    BP 140/80    Pulse 86    Temp 98.5 F (36.9 C)    Wt 140 lb (63.5 kg)    SpO2 96%    BMI 24.03 kg/m       Objective:   Physical Exam Vitals and nursing note reviewed.  Constitutional:      Appearance: Normal appearance.  Cardiovascular:     Rate and Rhythm: Normal rate and regular rhythm.     Pulses: Normal pulses.     Heart sounds: Normal heart sounds.  Pulmonary:     Effort: Pulmonary effort is normal.     Breath sounds: Normal breath sounds.  Abdominal:     General: Abdomen is flat.     Palpations: Abdomen is soft.     Tenderness: There is abdominal tenderness in the epigastric area.  Skin:    General: Skin is warm and dry.  Neurological:     General: No focal deficit present.     Mental Status: She is alert and oriented to person, place, and time.  Psychiatric:        Mood and Affect: Mood normal.         Behavior: Behavior normal.        Thought Content: Thought content normal.        Judgment: Judgment normal.      Assessment & Plan:  1. Epigastric pain - She did have some mild epigastric abdominal pain.  - Symptoms GERD vs Ambien withdrawal? Will send in prilosec that she can take for a few weeks if symptom do not improve after restarting ambien  - Advised follow up if no improving in the next week or two  - omeprazole (PRILOSEC) 20 MG capsule; Take 1 capsule (20 mg total) by mouth daily.  Dispense: 30 capsule; Refill: 0  2. Insomnia, unspecified type  - zolpidem (AMBIEN) 5 MG tablet; Take 1 tablet (5 mg total) by mouth daily.  Dispense: 30 tablet; Refill: 2  3. Loss of appetite   4. Diarrhea, unspecified type  Dorothyann Peng, NP

## 2021-12-30 ENCOUNTER — Other Ambulatory Visit: Payer: Self-pay | Admitting: Adult Health

## 2021-12-30 DIAGNOSIS — I10 Essential (primary) hypertension: Secondary | ICD-10-CM

## 2022-02-03 ENCOUNTER — Ambulatory Visit: Payer: Medicare Other | Admitting: Adult Health

## 2022-02-04 ENCOUNTER — Ambulatory Visit: Payer: Medicare Other | Admitting: Adult Health

## 2022-03-17 ENCOUNTER — Ambulatory Visit (INDEPENDENT_AMBULATORY_CARE_PROVIDER_SITE_OTHER): Payer: Medicare Other | Admitting: Adult Health

## 2022-03-17 VITALS — BP 126/80 | HR 67 | Temp 98.5°F | Ht 64.0 in | Wt 141.0 lb

## 2022-03-17 DIAGNOSIS — S60453A Superficial foreign body of left middle finger, initial encounter: Secondary | ICD-10-CM | POA: Diagnosis not present

## 2022-03-17 DIAGNOSIS — G4700 Insomnia, unspecified: Secondary | ICD-10-CM

## 2022-03-17 DIAGNOSIS — Z1211 Encounter for screening for malignant neoplasm of colon: Secondary | ICD-10-CM

## 2022-03-17 DIAGNOSIS — R1013 Epigastric pain: Secondary | ICD-10-CM | POA: Diagnosis not present

## 2022-03-17 DIAGNOSIS — T148XXA Other injury of unspecified body region, initial encounter: Secondary | ICD-10-CM

## 2022-03-17 MED ORDER — ZOLPIDEM TARTRATE 5 MG PO TABS: 5.0000 mg | ORAL_TABLET | Freq: Every day | ORAL | 2 refills | Status: DC

## 2022-03-17 MED ORDER — OMEPRAZOLE 20 MG PO CPDR: 20.0000 mg | DELAYED_RELEASE_CAPSULE | Freq: Every day | ORAL | 3 refills | Status: AC

## 2022-03-17 NOTE — Progress Notes (Addendum)
Subjective:    Patient ID: Shannon [name], female    DOB: 1951/07/08, 71 y.o.   MRN: [id number]  HPI 71 year old female who  has a past medical history of Allergy, Anemia, Bronchitis, GERD (gastroesophageal reflux disease), Headache(784.0), Hypertension, and Osteopenia.  She presents to the office today for an acute issue splinter removal.  She reports a wood splinter in the left  middle finger.  Originally noticed a splinter a couple months ago.  She denies signs of infection but continues to variance discomfort.  She also needs her Ambien and Prilosec refilled.    She would like to have her colonoscopy done as her last was in 2006    Review of Systems See HPI   Past Medical History:  Diagnosis Date   Allergy    Anemia    Bronchitis    in past   GERD (gastroesophageal reflux disease)    Headache(784.0)    Hypertension    Osteopenia     Social History   Socioeconomic History   Marital status: Married    Spouse name: Not on file   Number of children: Not on file   Years of education: Not on file   Highest education level: Not on file  Occupational History   Not on file  Tobacco Use   Smoking status: Former    Types: Cigarettes    Quit date: 01/31/2019    Years since quitting: 3.1   Smokeless tobacco: Never  Vaping Use   Vaping Use: Never used  Substance and Sexual Activity   Alcohol use: Yes    Alcohol/week: 5.0 standard drinks    Types: 5 Glasses of wine per week   Drug use: Not Currently    Types: Marijuana   Sexual activity: Yes  Other Topics Concern   Not on file  Social History Narrative   Married - Husband Juanda Crumble)   Never Smoked    Alcohol use-no      Occupation -  Biochemist, 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[place])           Social Determinants of Radio broadcast assistant Strain: Not on file  Food Insecurity: Not on file  Transportation Needs: Not on file  Physical Activity: Not on file  Stress: Not on file  Social  Connections: Not on file  Intimate Partner Violence: Not on file    Past Surgical History:  Procedure Laterality Date   BREAST LUMPECTOMY     left breast /benign   TUBAL LIGATION      Family History  Problem Relation Age of Onset   Hypertension Mother        age 38   Alzheimer's disease Father        deceased secondary to alzheimer's disease    Allergies  Allergen Reactions   Penicillins Shortness Of Breath   Corn-Containing Products Itching    Current Outpatient Medications on File Prior to Visit  Medication Sig Dispense Refill   Ascorbic Acid (VITAMIN C) 1000 MG tablet Take 1,000 mg by mouth daily.     fluticasone (FLONASE) 50 MCG/ACT nasal spray Place 2 sprays into both nostrils daily. 16 g 3   losartan-hydrochlorothiazide (HYZAAR) 100-25 MG tablet TAKE 1 TABLET BY MOUTH DAILY 90 tablet 0   Nebivolol HCl 20 MG TABS TAKE 1 TABLET(20 MG) BY MOUTH DAILY 90 tablet 3   valACYclovir (VALTREX) 500 MG tablet Take 1 tablet (500 mg total) by  mouth daily. Prophylaxis 90 tablet 4   No current facility-administered medications on file prior to visit.    BP 126/80   Pulse 67   Temp 98.5 F (36.9 C) (Oral)   Ht '5\' 4"'$  (1.626 m)   Wt 141 lb (64 kg)   SpO2 98%   BMI 24.20 kg/m       Objective:   Physical Exam Vitals and nursing note reviewed.  Constitutional:      Appearance: Normal appearance.  Musculoskeletal:        General: Normal range of motion.       Hands:  Skin:    General: Skin is warm and dry.  Neurological:     General: No focal deficit present.     Mental Status: She is alert and oriented to person, place, and time.  Psychiatric:        Mood and Affect: Mood normal.        Behavior: Behavior normal.        Judgment: Judgment normal.      Assessment & Plan:  1. Splinter in skin - verbal consent obtained. Using lidocaine 2 % without epi a digital block on the left middle finger was done around the peripheral nerve as she could not tolerate the  procedure without anesthesia . Using a 15 blade, the calloused skin was shaved off and a small sub centimeter wooden splinter was removed using tweezers from the dermis.   Bandaid was applied. After care instructions reviewed . Patient tolerated procedure well   2. Insomnia, unspecified type  - zolpidem (AMBIEN) 5 MG tablet; Take 1 tablet (5 mg total) by mouth daily.  Dispense: 30 tablet; Refill: 2  3. Epigastric pain  - omeprazole (PRILOSEC) 20 MG capsule; Take 1 capsule (20 mg total) by mouth daily.  Dispense: 90 capsule; Refill: 3  4. Colon cancer screening  - Ambulatory referral to Gastroenterology  Dorothyann Peng, NP

## 2022-04-26 ENCOUNTER — Other Ambulatory Visit: Payer: Self-pay | Admitting: Adult Health

## 2022-04-26 DIAGNOSIS — I10 Essential (primary) hypertension: Secondary | ICD-10-CM

## 2022-06-15 ENCOUNTER — Ambulatory Visit: Payer: Medicare Other

## 2022-06-15 ENCOUNTER — Telehealth: Payer: Self-pay

## 2022-06-15 NOTE — Telephone Encounter (Signed)
This nurse attempted to call patient three times for AWV. Message left that we will call again to reschedule for another time. 

## 2022-06-26 ENCOUNTER — Ambulatory Visit (INDEPENDENT_AMBULATORY_CARE_PROVIDER_SITE_OTHER): Payer: Medicare Other

## 2022-06-26 VITALS — Ht 66.0 in | Wt 135.0 lb

## 2022-06-26 DIAGNOSIS — Z1231 Encounter for screening mammogram for malignant neoplasm of breast: Secondary | ICD-10-CM | POA: Diagnosis not present

## 2022-06-26 DIAGNOSIS — Z0000 Encounter for general adult medical examination without abnormal findings: Secondary | ICD-10-CM | POA: Diagnosis not present

## 2022-06-26 DIAGNOSIS — Z1211 Encounter for screening for malignant neoplasm of colon: Secondary | ICD-10-CM

## 2022-06-26 NOTE — Progress Notes (Signed)
Subjective:   Shannon [name] is a 71 y.o. female who presents for Medicare Annual (Subsequent) preventive examination.  Review of Systems    Virtual Visit via Telephone Note  I connected with  Lyla Glassing on 06/26/22 at  3:30 PM EDT by telephone and verified that I am speaking with the correct person using two identifiers.  Location: Patient: Home Provider: Office Persons participating in the virtual visit: patient/Nurse Health Advisor   I discussed the limitations, risks, security and privacy concerns of performing an evaluation and management service by telephone and the availability of in person appointments. The patient expressed understanding and agreed to proceed.  Interactive audio and video telecommunications were attempted between this nurse and patient, however failed, due to patient having technical difficulties OR patient did not have access to video capability.  We continued and completed visit with audio only.  Some vital signs may be absent or patient reported.   Criselda Peaches, LPN  Cardiac Risk Factors include: advanced age (>57mn, >>32women);hypertension     Objective:    Today's Vitals   06/26/22 1536  Weight: 135 lb (61.2 kg)  Height: '5\' 6"'$  (1.676 m)   Body mass index is 21.79 kg/m.     06/26/2022    3:45 PM  Advanced Directives  Does Patient Have a Medical Advance Directive? No  Would patient like information on creating a medical advance directive? No - Patient declined    Current Medications (verified) Outpatient Encounter Medications as of 06/26/2022  Medication Sig   Ascorbic Acid (VITAMIN C) 1000 MG tablet Take 1,000 mg by mouth daily.   fluticasone (FLONASE) 50 MCG/ACT nasal spray Place 2 sprays into both nostrils daily.   losartan-hydrochlorothiazide (HYZAAR) 100-25 MG tablet Take 1 tablet by mouth daily.   Nebivolol HCl 20 MG TABS TAKE 1 TABLET(20 MG) BY MOUTH DAILY   omeprazole (PRILOSEC) 20 MG capsule Take 1 capsule (20 mg total)  by mouth daily.   valACYclovir (VALTREX) 500 MG tablet Take 1 tablet (500 mg total) by mouth daily. Prophylaxis   zolpidem (AMBIEN) 5 MG tablet Take 1 tablet (5 mg total) by mouth daily.   No facility-administered encounter medications on file as of 06/26/2022.    Allergies (verified) Penicillins and Corn-containing products   History: Past Medical History:  Diagnosis Date   Allergy    Anemia    Bronchitis    in past   GERD (gastroesophageal reflux disease)    Headache(784.0)    Hypertension    Osteopenia    Past Surgical History:  Procedure Laterality Date   BREAST LUMPECTOMY     left breast /benign   TUBAL LIGATION     Family History  Problem Relation Age of Onset   Hypertension Mother        age 70  Alzheimer's disease Father        deceased secondary to alzheimer's disease   Social History   Socioeconomic History   Marital status: Married    Spouse name: Not on file   Number of children: Not on file   Years of education: Not on file   Highest education level: Not on file  Occupational History   Not on file  Tobacco Use   Smoking status: Former    Types: Cigarettes    Quit date: 01/31/2019    Years since quitting: 3.4   Smokeless tobacco: Never  Vaping Use   Vaping Use: Never used  Substance and Sexual Activity  Alcohol use: Yes    Alcohol/week: 5.0 standard drinks of alcohol    Types: 5 Glasses of wine per week   Drug use: Not Currently    Types: Marijuana   Sexual activity: Yes  Other Topics Concern   Not on file  Social History Narrative   Married - Husband Juanda Crumble)   Never Smoked    Alcohol use-no      Occupation -  Biochemist, 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[place])           Social Determinants of Health   Financial Resource Strain: Low Risk  (06/26/2022)   Overall Financial Resource Strain (CARDIA)    Difficulty of Paying Living Expenses: Not hard at all  Food Insecurity: No Food Insecurity (06/26/2022)   Hunger Vital  Sign    Worried About Running Out of Food in the Last Year: Never true    [place] in the Last Year: Never true  Transportation Needs: No Transportation Needs (06/26/2022)   PRAPARE - Hydrologist (Medical): No    Lack of Transportation (Non-Medical): No  Physical Activity: Insufficiently Active (06/26/2022)   Exercise Vital Sign    Days of Exercise per Week: 3 days    Minutes of Exercise per Session: 30 min  Stress: No Stress Concern Present (06/26/2022)   [place]    Feeling of Stress : Not at all  Social Connections: Moderately Integrated (06/26/2022)   Social Connection and Isolation Panel [NHANES]    Frequency of Communication with Friends and Family: More than three times a week    Frequency of Social Gatherings with Friends and Family: More than three times a week    Attends Religious Services: More than 4 times per year    Active Member of [employer] or Organizations: Yes    Attends Archivist Meetings: More than 4 times per year    Marital Status: Widowed    Tobacco Counseling Counseling given: Not Answered   Clinical Intake:  Pre-visit preparation completed: No  Pain : No/denies pain     BMI - recorded: 24.19 Nutritional Status: BMI of 19-24  Normal Nutritional Risks: None Diabetes: No  How often do you need to have someone help you when you read instructions, pamphlets, or other written materials from your doctor or pharmacy?: 1 - Never  Diabetic?  No  Interpreter Needed?: No  Information entered by :: Rolene Arbour LPN   Activities of Daily Living    06/26/2022    3:43 PM 09/18/2021   12:59 PM  In your present state of health, do you have any difficulty performing the following activities:  Hearing? 0 0  Vision? 0 0  Difficulty concentrating or making decisions? 0 0  Walking or climbing stairs? 0 0  Dressing or bathing? 0 0  Doing errands,  shopping? 0 0  Preparing Food and eating ? N   Using the Toilet? N   In the past six months, have you accidently leaked urine? N   Do you have problems with loss of bowel control? N   Managing your Medications? N   Managing your Finances? N   Housekeeping or managing your Housekeeping? N     Patient Care Team: Dorothyann Peng, NP as PCP - General (Family Medicine)  Indicate any recent Medical Services you may have received from other than [facility] providers in the past year (  date may be approximate).     Assessment:   This is a routine wellness examination for [place].  Hearing/Vision screen Hearing Screening - Comments:: No hearing difficulty Vision Screening - Comments:: Wears glasses.   Dietary issues and exercise activities discussed: Exercise limited by: None identified   Goals Addressed               This Visit's Progress     Patient stated (pt-stated)        Continue to work on my [place].       Depression Screen    06/26/2022    3:41 PM 09/18/2021   12:59 PM 09/25/2020    7:48 AM 11/25/2017    2:24 PM  PHQ 2/9 Scores  PHQ - 2 Score 0 0 0 0    Fall Risk    06/26/2022    3:43 PM 09/18/2021   12:59 PM 04/09/2021    2:58 PM 02/21/2019    9:01 AM 11/25/2017    2:24 PM  [place] in the past year? 0 0 1 0 No  Number falls in past yr: 0 0 1 0   Injury with Fall? 0 0 1 0   Risk for fall due to : No Fall Risks        FALL RISK PREVENTION PERTAINING TO THE HOME:  Any stairs in or around the home? Yes  If so, are there any without handrails? No  Home free of loose throw rugs in walkways, pet beds, electrical cords, etc? Yes  Adequate lighting in your home to reduce risk of falls? Yes   ASSISTIVE DEVICES UTILIZED TO PREVENT FALLS:  Life alert? No  Use of a cane, walker or w/c? No  Grab bars in the bathroom? No  Shower chair or bench in shower? Yes Elevated toilet seat or a handicapped toilet? No   TIMED UP AND GO:  Was the test performed? No . Audio  Visit  Cognitive Function:        06/26/2022    3:45 PM  6CIT Screen  What Year? 0 points  What month? 0 points  What time? 0 points  Count back from 20 0 points  Months in reverse 0 points  Repeat phrase 0 points  Total Score 0 points    Immunizations Immunization History  Administered Date(s) Administered   Fluad Quad(high Dose 65+) 10/03/2019   Influenza,inj,Quad PF,6+ Mos 01/15/2014, 09/12/2014, 08/21/2016, 09/18/2021   PFIZER(Purple Top)SARS-COV-2 Vaccination 01/08/2020, 02/08/2020   PPD Test 01/15/2014, 03/26/2014, 04/04/2014, 12/05/2019   Pneumococcal Conjugate-13 11/25/2017   Pneumococcal Polysaccharide-23 10/03/2019   Td 12/17/2008    TDAP status: Due, Education has been provided regarding the importance of this vaccine. Advised may receive this vaccine at local pharmacy or Health Dept. Aware to provide a copy of the vaccination record if obtained from local pharmacy or Health Dept. Verbalized acceptance and understanding.  Flu Vaccine status: Up to date  Pneumococcal vaccine status: Up to date  Covid-19 vaccine status: Completed vaccines  Qualifies for Shingles Vaccine? Yes   Zostavax completed No   Shingrix Completed?: No.    Education has been provided regarding the importance of this vaccine. Patient has been advised to call insurance company to determine out of pocket expense if they have not yet received this vaccine. Advised may also receive vaccine at local pharmacy or Health Dept. Verbalized acceptance and understanding.  Screening Tests Health Maintenance  Topic Date Due   COLONOSCOPY (Pts 45-67yr Insurance coverage will  need to be confirmed)  12/17/2014   MAMMOGRAM  04/17/2019   INFLUENZA VACCINE  06/09/2022   COVID-19 Vaccine (3 - Pfizer series) 07/12/2022 (Originally 04/04/2020)   Zoster Vaccines- Shingrix (1 of 2) 09/26/2022 (Originally 10/03/2002)   TETANUS/TDAP  06/27/2023 (Originally 12/17/2018)   Pneumonia Vaccine 85+ Years old  Completed    DEXA SCAN  Completed   Hepatitis C Screening  Completed   HPV VACCINES  Aged Out    Health Maintenance  Health Maintenance Due  Topic Date Due   COLONOSCOPY (Pts 45-47yr Insurance coverage will need to be confirmed)  12/17/2014   MAMMOGRAM  04/17/2019   INFLUENZA VACCINE  06/09/2022    Colorectal cancer screening: Referral to GI placed 06/26/22. Pt aware the office will call re: appt.  Mammogram status: Ordered 06/26/22. Pt provided with contact info and advised to call to schedule appt.   Bone Density status: Completed 03/25/17. Results reflect: Bone density results: OSTEOPOROSIS. Repeat every   years.  Lung Cancer Screening: (Low Dose CT Chest recommended if Age 70-80years, 30 pack-year currently smoking OR have quit w/in 15years.) does not qualify.     Additional Screening:  Hepatitis C Screening: does qualify; Completed 02/01/18  Vision Screening: Recommended annual ophthalmology exams for early detection of glaucoma and other disorders of the eye. Is the patient up to date with their annual eye exam?  Yes  Who is the provider or what is the name of the office in which the patient attends annual eye exams? Patient deferred If pt is not established with a provider, would they like to be referred to a provider to establish care? No .   Dental Screening: Recommended annual dental exams for proper oral hygiene  Community Resource Referra l / Chronic Care Management:  CRR required this visit?  No   CCM required this visit?  No      Plan:     I have personally reviewed and noted the following in the patient's chart:   Medical and social history Use of alcohol, tobacco or illicit drugs  Current medications and supplements including opioid prescriptions. Patient is not currently taking opioid prescriptions. Functional ability and status Nutritional status Physical activity Advanced directives List of other physicians Hospitalizations, surgeries, and ER visits in  previous 12 months Vitals Screenings to include cognitive, depression, and falls Referrals and appointments  In addition, I have reviewed and discussed with patient certain preventive protocols, quality metrics, and best practice recommendations. A written personalized care plan for preventive services as well as general preventive health recommendations were provided to patient.     BCriselda Peaches LPN   [id number]  Nurse Notes: Patient request f/u with concerns of changes in sleep pattern.

## 2022-06-26 NOTE — Patient Instructions (Addendum)
[name] , Thank you for taking time to come for your Medicare Wellness Visit. I appreciate your ongoing commitment to your health goals. Please review the following plan we discussed and let me know if I can assist you in the future.   These are the goals we discussed:  Goals       Patient stated (pt-stated)      Continue to work on my [place].        This is a list of the screening recommended for you and due dates:  Health Maintenance  Topic Date Due   Colon Cancer Screening  12/17/2014   Mammogram  04/17/2019   Flu Shot  06/09/2022   COVID-19 Vaccine (3 - Pfizer series) 07/12/2022*   Zoster (Shingles) Vaccine (1 of 2) 09/26/2022*   Tetanus Vaccine  06/27/2023*   Pneumonia Vaccine  Completed   DEXA scan (bone density measurement)  Completed   Hepatitis C Screening: USPSTF Recommendation to screen - Ages 7-79 yo.  Completed   HPV Vaccine  Aged Out  *Topic was postponed. The date shown is not the original due date.    Advanced directives: No  Conditions/risks identified: None  Next appointment: Follow up in one year for your annual wellness visit     Preventive Care 65 Years and Older, Female Preventive care refers to lifestyle choices and visits with your health care provider that can promote health and wellness. What does preventive care include? A yearly physical exam. This is also called an annual well check. Dental exams once or twice a year. Routine eye exams. Ask your health care provider how often you should have your eyes checked. Personal lifestyle choices, including: Daily care of your teeth and gums. Regular physical activity. Eating a healthy diet. Avoiding tobacco and drug use. Limiting alcohol use. Practicing safe sex. Taking low-dose aspirin every day. Taking vitamin and mineral supplements as recommended by your health care provider. What happens during an annual well check? The services and screenings done by your health care provider during your  annual well check will depend on your age, overall health, lifestyle risk factors, and family history of disease. Counseling  Your health care provider may ask you questions about your: Alcohol use. Tobacco use. Drug use. Emotional well-being. Home and relationship well-being. Sexual activity. Eating habits. History of falls. Memory and ability to understand (cognition). Work and work Statistician. Reproductive health. Screening  You may have the following tests or measurements: Height, weight, and BMI. Blood pressure. Lipid and cholesterol levels. These may be checked every 5 years, or more frequently if you are over 79 years old. Skin check. Lung cancer screening. You may have this screening every year starting at age 59 if you have a 30-pack-year history of smoking and currently smoke or have quit within the past 15 years. Fecal occult blood test (FOBT) of the stool. You may have this test every year starting at age 34. Flexible sigmoidoscopy or colonoscopy. You may have a sigmoidoscopy every 5 years or a colonoscopy every 10 years starting at age 1. Hepatitis C blood test. Hepatitis B blood test. Sexually transmitted disease (STD) testing. Diabetes screening. This is done by checking your blood sugar (glucose) after you have not eaten for a while (fasting). You may have this done every 1-3 years. Bone density scan. This is done to screen for osteoporosis. You may have this done starting at age 74. Mammogram. This may be done every 1-2 years. Talk to your health care provider about  how often you should have regular mammograms. Talk with your health care provider about your test results, treatment options, and if necessary, the need for more tests. Vaccines  Your health care provider may recommend certain vaccines, such as: Influenza vaccine. This is recommended every year. Tetanus, diphtheria, and acellular pertussis (Tdap, Td) vaccine. You may need a Td booster every 10  years. Zoster vaccine. You may need this after age 21. Pneumococcal 13-valent conjugate (PCV13) vaccine. One dose is recommended after age 2. Pneumococcal polysaccharide (PPSV23) vaccine. One dose is recommended after age 59. Talk to your health care provider about which screenings and vaccines you need and how often you need them. This information is not intended to replace advice given to you by your health care provider. Make sure you discuss any questions you have with your health care provider. Document Released: 11/22/2015 Document Revised: 07/15/2016 Document Reviewed: 08/27/2015 Elsevier Interactive Patient Education  2017 [place] Prevention in the Home Falls can cause injuries. They can happen to people of all ages. There are many things you can do to make your home safe and to help prevent falls. What can I do on the outside of my home? Regularly fix the edges of walkways and driveways and fix any cracks. Remove anything that might make you trip as you walk through a door, such as a raised step or threshold. Trim any bushes or trees on the path to your home. Use bright outdoor lighting. Clear any walking paths of anything that might make someone trip, such as rocks or tools. Regularly check to see if handrails are loose or broken. Make sure that both sides of any steps have handrails. Any raised decks and porches should have guardrails on the edges. Have any leaves, snow, or ice cleared regularly. Use sand or salt on walking paths during winter. Clean up any spills in your garage right away. This includes oil or grease spills. What can I do in the bathroom? Use night lights. Install grab bars by the toilet and in the tub and shower. Do not use towel bars as grab bars. Use non-skid mats or decals in the tub or shower. If you need to sit down in the shower, use a plastic, non-slip stool. Keep the floor dry. Clean up any water that spills on the floor as soon as it  happens. Remove soap buildup in the tub or shower regularly. Attach bath mats securely with double-sided non-slip rug tape. Do not have throw rugs and other things on the floor that can make you trip. What can I do in the bedroom? Use night lights. Make sure that you have a light by your bed that is easy to reach. Do not use any sheets or blankets that are too big for your bed. They should not hang down onto the floor. Have a firm chair that has side arms. You can use this for support while you get dressed. Do not have throw rugs and other things on the floor that can make you trip. What can I do in the kitchen? Clean up any spills right away. Avoid walking on wet floors. Keep items that you use a lot in easy-to-reach places. If you need to reach something above you, use a strong step stool that has a grab bar. Keep electrical cords out of the way. Do not use floor polish or wax that makes floors slippery. If you must use wax, use non-skid floor wax. Do not have throw rugs and other  things on the floor that can make you trip. What can I do with my stairs? Do not leave any items on the stairs. Make sure that there are handrails on both sides of the stairs and use them. Fix handrails that are broken or loose. Make sure that handrails are as long as the stairways. Check any carpeting to make sure that it is firmly attached to the stairs. Fix any carpet that is loose or worn. Avoid having throw rugs at the top or bottom of the stairs. If you do have throw rugs, attach them to the floor with carpet tape. Make sure that you have a light switch at the top of the stairs and the bottom of the stairs. If you do not have them, ask someone to add them for you. What else can I do to help prevent falls? Wear shoes that: Do not have high heels. Have rubber bottoms. Are comfortable and fit you well. Are closed at the toe. Do not wear sandals. If you use a stepladder: Make sure that it is fully opened.  Do not climb a closed stepladder. Make sure that both sides of the stepladder are locked into place. Ask someone to hold it for you, if possible. Clearly mark and make sure that you can see: Any grab bars or handrails. First and last steps. Where the edge of each step is. Use tools that help you move around (mobility aids) if they are needed. These include: Canes. Walkers. Scooters. Crutches. Turn on the lights when you go into a dark area. Replace any light bulbs as soon as they burn out. Set up your furniture so you have a clear path. Avoid moving your furniture around. If any of your floors are uneven, fix them. If there are any pets around you, be aware of where they are. Review your medicines with your doctor. Some medicines can make you feel dizzy. This can increase your chance of falling. Ask your doctor what other things that you can do to help prevent falls. This information is not intended to replace advice given to you by your health care provider. Make sure you discuss any questions you have with your health care provider. Document Released: 08/22/2009 Document Revised: 04/02/2016 Document Reviewed: 11/30/2014 Elsevier Interactive Patient Education  2017 [employer].

## 2022-07-22 ENCOUNTER — Ambulatory Visit (INDEPENDENT_AMBULATORY_CARE_PROVIDER_SITE_OTHER): Payer: Medicare Other | Admitting: Adult Health

## 2022-07-22 VITALS — BP 130/80 | HR 73 | Temp 98.5°F | Ht 66.0 in | Wt 133.0 lb

## 2022-07-22 DIAGNOSIS — R630 Anorexia: Secondary | ICD-10-CM | POA: Diagnosis not present

## 2022-07-22 DIAGNOSIS — G479 Sleep disorder, unspecified: Secondary | ICD-10-CM | POA: Diagnosis not present

## 2022-07-22 DIAGNOSIS — S0990XA Unspecified injury of head, initial encounter: Secondary | ICD-10-CM

## 2022-07-22 MED ORDER — MIRTAZAPINE 15 MG PO TABS: 15.0000 mg | ORAL_TABLET | Freq: Every day | ORAL | 0 refills | Status: DC

## 2022-07-22 NOTE — Progress Notes (Signed)
Subjective:    Patient ID: Shannon [name], female    DOB: 1951/05/10, 71 y.o.   MRN: [id number]  HPI  71 year old female who  has a past medical history of Allergy, Anemia, Bronchitis, GERD (gastroesophageal reflux disease), Headache(784.0), Hypertension, and Osteopenia.  She presents to the office today for multiple issues   Her first issue is that of head injury. She reports that five days ago she was standing on a chair cleaning out her kitchen cabinets and the chair tipped over and she fell hitting her head on a piece of wood. She denies LOC after the fall. Two days after she started to developed a headache that has lasted for the last few days. Headache has been constant but tylenol helps alleviate some of the pain and headaches seems to be getting better as well as the soreness of her head that she developed after the fall  She denies vision changes, confusion, issues with speaking, or walking, light or sound sensitivity.  Her husband and mother passed away over the last year, since that time she has had decreased appetite with about 8 pound weight loss.  She reports that she is eating but almost feels as though she has to force herself to eat.  When she does eat it is more fruits and vegetables.  She does not feel acutely ill, had any fevers, chills, or abdominal pain.  Wt Readings from Last 3 Encounters:  07/22/22 133 lb (60.3 kg)  06/26/22 135 lb (61.2 kg)  03/17/22 141 lb (64 kg)    She also reports that for the last year or so she has been struggling with sleep.  She is able to fall asleep but will often wake up in the middle the night and not be able to go back to sleep.  She denies anxiety, nightmares, or apneic episodes.  She does not feel depressed.  At home she has tried melatonin and Benadryl but this still was not able to keep her sleeping through the night.  Review of Systems See HPI   Past Medical History:  Diagnosis Date   Allergy    Anemia    Bronchitis    in  past   GERD (gastroesophageal reflux disease)    Headache(784.0)    Hypertension    Osteopenia     Social History   Socioeconomic History   Marital status: Married    Spouse name: Not on file   Number of children: Not on file   Years of education: Not on file   Highest education level: Not on file  Occupational History   Not on file  Tobacco Use   Smoking status: Former    Types: Cigarettes    Quit date: 01/31/2019    Years since quitting: 3.4   Smokeless tobacco: Never  Vaping Use   Vaping Use: Never used  Substance and Sexual Activity   Alcohol use: Yes    Alcohol/week: 5.0 standard drinks of alcohol    Types: 5 Glasses of wine per week   Drug use: Not Currently    Types: Marijuana   Sexual activity: Yes  Other Topics Concern   Not on file  Social History Narrative   Married - Husband Juanda Crumble)   Never Smoked    Alcohol use-no      Occupation -  Biochemist, 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[place])           Social Determinants of  Health   Financial Resource Strain: Low Risk  (06/26/2022)   Overall Financial Resource Strain (CARDIA)    Difficulty of Paying Living Expenses: Not hard at all  Food Insecurity: No Food Insecurity (06/26/2022)   Hunger Vital Sign    Worried About Running Out of Food in the Last Year: Never true    Ran Out of Food in the Last Year: Never true  Transportation Needs: No Transportation Needs (06/26/2022)   PRAPARE - Hydrologist (Medical): No    Lack of Transportation (Non-Medical): No  Physical Activity: Insufficiently Active (06/26/2022)   Exercise Vital Sign    Days of Exercise per Week: 3 days    Minutes of Exercise per Session: 30 min  Stress: No Stress Concern Present (06/26/2022)   [place]    Feeling of Stress : Not at all  Social Connections: Moderately Integrated (06/26/2022)   Social Connection and Isolation Panel  [NHANES]    Frequency of Communication with Friends and Family: More than three times a week    Frequency of Social Gatherings with Friends and Family: More than three times a week    Attends Religious Services: More than 4 times per year    Active Member of [employer] or Organizations: Yes    Attends Archivist Meetings: More than 4 times per year    Marital Status: Widowed  Intimate Partner Violence: Not At Risk (06/26/2022)   Humiliation, Afraid, Rape, and Kick questionnaire    Fear of Current or Ex-Partner: No    Emotionally Abused: No    Physically Abused: No    Sexually Abused: No    Past Surgical History:  Procedure Laterality Date   BREAST LUMPECTOMY     left breast /benign   TUBAL LIGATION      Family History  Problem Relation Age of Onset   Hypertension Mother        age 36   Alzheimer's disease Father        deceased secondary to alzheimer's disease    Allergies  Allergen Reactions   Penicillins Shortness Of Breath   Corn-Containing Products Itching    Current Outpatient Medications on File Prior to Visit  Medication Sig Dispense Refill   losartan-hydrochlorothiazide (HYZAAR) 100-25 MG tablet Take 1 tablet by mouth daily. 90 tablet 1   Nebivolol HCl 20 MG TABS TAKE 1 TABLET(20 MG) BY MOUTH DAILY 90 tablet 3   omeprazole (PRILOSEC) 20 MG capsule Take 1 capsule (20 mg total) by mouth daily. 90 capsule 3   zolpidem (AMBIEN) 5 MG tablet Take 1 tablet (5 mg total) by mouth daily. 30 tablet 2   tinidazole (TINDAMAX) 500 MG tablet Take 2,000 mg by mouth daily.     No current facility-administered medications on file prior to visit.    BP 130/80   Pulse 73   Temp 98.5 F (36.9 C) (Oral)   Ht '5\' 6"'$  (1.676 m)   Wt 133 lb (60.3 kg)   SpO2 98%   BMI 21.47 kg/m       Objective:   Physical Exam Vitals and nursing note reviewed.  Constitutional:      Appearance: Normal appearance.  HENT:     Head:      Right Ear: No middle ear effusion. No  hemotympanum.     Left Ear:  No middle ear effusion. No hemotympanum.  Cardiovascular:     Rate and Rhythm: Normal rate and  regular rhythm.     Pulses: Normal pulses.     Heart sounds: Normal heart sounds.  Pulmonary:     Effort: Pulmonary effort is normal.     Breath sounds: Normal breath sounds.  Musculoskeletal:        General: Normal range of motion.  Skin:    General: Skin is warm and dry.  Neurological:     General: No focal deficit present.     Mental Status: She is alert and oriented to person, place, and time.  Psychiatric:        Mood and Affect: Mood normal.        Behavior: Behavior normal.        Thought Content: Thought content normal.        Judgment: Judgment normal.       Assessment & Plan:  1. Injury of head, initial encounter -Likely had a mild concussion.  Headaches are improving.  We discussed getting imaging of the brain to make sure there is no intracranial hemorrhage.  She refused at this time which I think is reasonable as she is not showing any signs or symptoms.  Discussed red flags and she will let me know if any develop  2. Sleep disturbance -Trial her on Remeron for both sleep disturbance and decreased appetite.  We will have her follow-up in 30 days or sooner via MyChart. - mirtazapine (REMERON) 15 MG tablet; Take 1 tablet (15 mg total) by mouth at bedtime.  Dispense: 30 tablet; Refill: 0  3. Decreased appetite  - mirtazapine (REMERON) 15 MG tablet; Take 1 tablet (15 mg total) by mouth at bedtime.  Dispense: 30 tablet; Refill: 0  Dorothyann Peng, NP  Time spent with patient today was 34 minutes which consisted of chart review, discussing head trauma, sleep issues, and decreased appetite, work up, treatment answering questions and documentation.

## 2022-08-26 ENCOUNTER — Other Ambulatory Visit: Payer: Self-pay | Admitting: Adult Health

## 2022-08-26 DIAGNOSIS — Z1231 Encounter for screening mammogram for malignant neoplasm of breast: Secondary | ICD-10-CM

## 2022-09-03 ENCOUNTER — Encounter: Payer: Self-pay | Admitting: Gastroenterology

## 2022-09-03 ENCOUNTER — Emergency Department (HOSPITAL_COMMUNITY)
Admission: EM | Admit: 2022-09-03 | Discharge: 2022-09-03 | Disposition: A | Discharge status: Home / Self Care | Payer: Medicare Other | Attending: Emergency Medicine | Admitting: Emergency Medicine

## 2022-09-03 ENCOUNTER — Encounter (HOSPITAL_COMMUNITY): Payer: Self-pay | Admitting: *Deleted

## 2022-09-03 ENCOUNTER — Emergency Department (HOSPITAL_COMMUNITY): Payer: Medicare Other

## 2022-09-03 ENCOUNTER — Other Ambulatory Visit: Payer: Self-pay

## 2022-09-03 DIAGNOSIS — I10 Essential (primary) hypertension: Secondary | ICD-10-CM | POA: Diagnosis not present

## 2022-09-03 DIAGNOSIS — W01198A Fall on same level from slipping, tripping and stumbling with subsequent striking against other object, initial encounter: Secondary | ICD-10-CM | POA: Diagnosis not present

## 2022-09-03 DIAGNOSIS — Z79899 Other long term (current) drug therapy: Secondary | ICD-10-CM | POA: Insufficient documentation

## 2022-09-03 DIAGNOSIS — R519 Headache, unspecified: Secondary | ICD-10-CM | POA: Diagnosis present

## 2022-09-03 DIAGNOSIS — F0781 Postconcussional syndrome: Secondary | ICD-10-CM | POA: Insufficient documentation

## 2022-09-03 NOTE — ED Provider Notes (Signed)
[facility] DEPT Provider Note   CSN: [id number] Arrival date & time: 09/03/22  1346     History  Chief Complaint  Patient presents with   Shannon [name] is a 71 y.o. female.   Fall  Patient presents with headaches after fall.  Around a month ago fell and hit her head.  Had some headaches at the time.  Lasted for couple weeks but then clear itself up.  Had seen PCP and been given some medicines.  Her aches had resolved but around a week ago began to have headaches again.  She had struck the right side of her head and this with headache for but now more on the front of her head.  No vision changes.  No numbness weakness.  No photophobia.  States to the days she had in the morning and then later it started after the morning.  No relief with Tylenol.  Of note also ran out of her blood pressure medicines about 2 days ago although the headaches were started before that.  States she has refills at the pharmacy and is ready to pick them up.    Past Medical History:  Diagnosis Date   Allergy    Anemia    Bronchitis    in past   GERD (gastroesophageal reflux disease)    Headache(784.0)    Hypertension    Osteopenia     Home Medications Prior to Admission medications   Medication Sig Start Date End Date Taking? Authorizing Provider  losartan-hydrochlorothiazide (HYZAAR) 100-25 MG tablet Take 1 tablet by mouth daily. 04/27/22   Nafziger, Tommi Rumps, NP  mirtazapine (REMERON) 15 MG tablet Take 1 tablet (15 mg total) by mouth at bedtime. 07/22/22   Nafziger, Tommi Rumps, NP  Nebivolol HCl 20 MG TABS TAKE 1 TABLET(20 MG) BY MOUTH DAILY 09/18/21   Nafziger, Tommi Rumps, NP  omeprazole (PRILOSEC) 20 MG capsule Take 1 capsule (20 mg total) by mouth daily. 03/17/22   Nafziger, Tommi Rumps, NP  tinidazole (TINDAMAX) 500 MG tablet Take 2,000 mg by mouth daily. 07/17/22   [provider]  zolpidem (AMBIEN) 5 MG tablet Take 1 tablet (5 mg total) by mouth daily. 03/17/22   Nafziger,  Tommi Rumps, NP      Allergies    Penicillins and Corn-containing products    Review of Systems   Review of Systems  Physical Exam Updated Vital Signs BP (!) 195/81 (BP Location: Right Arm)   Pulse 62   Temp 98.4 F (36.9 C) (Oral)   Resp 15   Ht '5\' 7"'$  (1.702 m)   Wt 60.3 kg   SpO2 99%   BMI 20.83 kg/m  Physical Exam Vitals and nursing note reviewed.  Constitutional:      Appearance: Normal appearance.  HENT:     Head: Atraumatic.  Eyes:     Extraocular Movements: Extraocular movements intact.     Pupils: Pupils are equal, round, and reactive to light.  Cardiovascular:     Rate and Rhythm: Regular rhythm.  Pulmonary:     Breath sounds: No wheezing or rhonchi.  Abdominal:     Tenderness: There is no abdominal tenderness.  Musculoskeletal:        General: No tenderness.  Skin:    General: Skin is warm.     Capillary Refill: Capillary refill takes less than 2 seconds.  Neurological:     Mental Status: She is alert and oriented to person, place, and time.     ED  Results / Procedures / Treatments   Labs (all labs ordered are listed, but only abnormal results are displayed) Labs Reviewed - No data to display  EKG None  Radiology No results found.  Procedures Procedures    Medications Ordered in ED Medications - No data to display  ED Course/ Medical Decision Making/ A&P                           Medical Decision Making  Patient with headache.  Has had episodes of it since a fall around a month or month and a half ago where she hit her head.  Potentially could be concussion.  Head CT reassuring. Of note patient does have some hypertension.  However the headache preceded the run out of medicine.  States she has a refill on his.  States she will take the pill today.  Do not think we need acute lowering of the blood pressure.  Potentially could be postconcussive syndrome.  Offered to treat the headache but patient states she is fine at this point.  Will discharge  home with outpatient follow-up.  If needed PCP can have her follow-up with the concussion clinic.        Final Clinical Impression(s) / ED Diagnoses Final diagnoses:  Post concussion syndrome  Hypertension, unspecified type    Rx / DC Orders ED Discharge Orders     None         Davonna Belling, MD 09/03/22 1629

## 2022-09-03 NOTE — ED Provider Triage Note (Signed)
Emergency Medicine Provider Triage Evaluation Note  Shannon [name] , a 71 y.o. female  was evaluated in triage.  Pt complains of headache following head trauma that occurred about a month ago.  She states she was standing on a chair and as she was getting mail she slipped and hit her head on the stove.  Denies loss of consciousness or bleeding.  Was evaluated by PCP after and was given pain medication.  She was doing relatively well until last week since then she has had headache that she rates as 7/10 constantly.  Neurological exam is overall reassuring without focal deficits.  Denies nausea, vomiting, visual change, balance issues.  Review of Systems  Positive: As above Negative: As above  Physical Exam  BP (!) 195/81 (BP Location: Right Arm)   Pulse 62   Temp 98.4 F (36.9 C) (Oral)   Resp 15   Ht '5\' 7"'$  (1.702 m)   Wt 60.3 kg   SpO2 99%   BMI 20.83 kg/m  Gen:   Awake, no distress   Resp:  Normal effort  MSK:   Moves extremities without difficulty  Other:    Medical Decision Making  Medically screening exam initiated at 2:06 PM.  Appropriate orders placed.  Shannon [name] was informed that the remainder of the evaluation will be completed by another provider, this initial triage assessment does not replace that evaluation, and the importance of remaining in the ED until their evaluation is complete.     Evlyn Courier, PA-C 09/03/22 1407

## 2022-09-03 NOTE — ED Triage Notes (Signed)
Pt fell over a month ago, hit head on stove, followed up with her PA a couple days a later, now has started to have headaches and was told to come to the ED

## 2022-09-07 ENCOUNTER — Telehealth: Payer: Self-pay

## 2022-09-07 ENCOUNTER — Other Ambulatory Visit: Payer: Self-pay | Admitting: Obstetrics and Gynecology

## 2022-09-07 DIAGNOSIS — M810 Age-related osteoporosis without current pathological fracture: Secondary | ICD-10-CM

## 2022-09-07 NOTE — Telephone Encounter (Signed)
Transition Care Management Follow-up Telephone Call Date of discharge and from where: 09/03/22 from [facility] for Postconcussional syndrome How have you been since you were released from the hospital? Pt states she's feeling better, still having mild h/a. Is taking OTC Naproxen. Advised for her to check with her pharmacy for safe medications to take with HTN. Pt verb understanding. Rates h/a as 7 on 0-10 pain scale. Any questions or concerns? No  Items Reviewed: Did the pt receive and understand the discharge instructions provided? Yes  Medications obtained and verified? Yes  Other? Pt has seen PCP for this on 07/21/22 Any new allergies since your discharge? No   Follow up appointments reviewed:  PCP Hospital f/u appt confirmed? Yes  Scheduled to see Dorothyann Peng NP on Nov 7 @ 2pm. Are transportation arrangements needed? No  If their condition worsens, is the pt aware to call PCP or go to the Emergency Dept.? Yes Was the patient provided with contact information for the PCP's office or ED? Yes Was to pt encouraged to call back with questions or concerns? Yes

## 2022-09-14 ENCOUNTER — Ambulatory Visit (AMBULATORY_SURGERY_CENTER): Payer: Self-pay | Admitting: *Deleted

## 2022-09-14 VITALS — Ht 67.0 in | Wt 147.0 lb

## 2022-09-14 DIAGNOSIS — Z1211 Encounter for screening for malignant neoplasm of colon: Secondary | ICD-10-CM

## 2022-09-14 MED ORDER — NA SULFATE-K SULFATE-MG SULF 17.5-3.13-1.6 GM/177ML PO SOLN: 1.0000 | Freq: Once | ORAL | 0 refills | Status: AC

## 2022-09-14 NOTE — Progress Notes (Signed)
No egg or soy allergy known to patient  No issues known to pt with past sedation with any surgeries or procedures Patient denies ever being told they had issues or difficulty with intubation  No FH of Malignant Hyperthermia Pt is not on diet pills Pt is not on home 02  Pt is not on blood thinners  Pt denies issues with constipation  No A fib or A flutter Have any cardiac testing pending--NO Pt instructed to use Singlecare.com or GoodRx for a price reduction on prep   

## 2022-09-15 ENCOUNTER — Ambulatory Visit: Payer: Medicare Other | Admitting: Adult Health

## 2022-10-06 ENCOUNTER — Ambulatory Visit (AMBULATORY_SURGERY_CENTER): Payer: Medicare Other | Admitting: Gastroenterology

## 2022-10-06 ENCOUNTER — Encounter: Payer: Self-pay | Admitting: Gastroenterology

## 2022-10-06 VITALS — BP 145/67 | HR 61 | Temp 96.9°F | Resp 11 | Ht 67.0 in | Wt 147.0 lb

## 2022-10-06 DIAGNOSIS — D125 Benign neoplasm of sigmoid colon: Secondary | ICD-10-CM

## 2022-10-06 DIAGNOSIS — Z1211 Encounter for screening for malignant neoplasm of colon: Secondary | ICD-10-CM

## 2022-10-06 DIAGNOSIS — K635 Polyp of colon: Secondary | ICD-10-CM | POA: Diagnosis not present

## 2022-10-06 MED ORDER — SODIUM CHLORIDE 0.9 % IV SOLN: 500.0000 mL | Freq: Once | INTRAVENOUS | Status: DC

## 2022-10-06 NOTE — Patient Instructions (Signed)
Handouts provided on polyps, diverticulosis and hemorrhoids.   Resume previous diet. Continue present medications.  Await pathology results.  Repeat colonoscopy in 5-10 years for surveillance.   YOU HAD AN ENDOSCOPIC PROCEDURE TODAY AT [facility]:   Refer to the procedure report that was given to you for any specific questions about what was found during the examination.  If the procedure report does not answer your questions, please call your gastroenterologist to clarify.  If you requested that your care partner not be given the details of your procedure findings, then the procedure report has been included in a sealed envelope for you to review at your convenience later.  YOU SHOULD EXPECT: Some feelings of bloating in the abdomen. Passage of more gas than usual.  Walking can help get rid of the air that was put into your GI tract during the procedure and reduce the bloating. If you had a lower endoscopy (such as a colonoscopy or flexible sigmoidoscopy) you may notice spotting of blood in your stool or on the toilet paper. If you underwent a bowel prep for your procedure, you may not have a normal bowel movement for a few days.  Please Note:  You might notice some irritation and congestion in your nose or some drainage.  This is from the oxygen used during your procedure.  There is no need for concern and it should clear up in a day or so.  SYMPTOMS TO REPORT IMMEDIATELY:  Following lower endoscopy (colonoscopy or flexible sigmoidoscopy):  Excessive amounts of blood in the stool  Significant tenderness or worsening of abdominal pains  Swelling of the abdomen that is new, acute  Fever of 100F or higher  For urgent or emergent issues, a gastroenterologist can be reached at any hour by calling [phone]. Do not use MyChart messaging for urgent concerns.    DIET:  We do recommend a small meal at first, but then you may proceed to your regular diet.  Drink plenty of  fluids but you should avoid alcoholic beverages for 24 hours.  ACTIVITY:  You should plan to take it easy for the rest of today and you should NOT DRIVE or use heavy machinery until tomorrow (because of the sedation medicines used during the test).    FOLLOW UP: Our staff will call the number listed on your records the next business day following your procedure.  We will call around 7:15- 8:00 am to check on you and address any questions or concerns that you may have regarding the information given to you following your procedure. If we do not reach you, we will leave a message.     If any biopsies were taken you will be contacted by phone or by letter within the next 1-3 weeks.  Please call us at [phone] if you have not heard about the biopsies in 3 weeks.    SIGNATURES/CONFIDENTIALITY: You and/or your care partner have signed paperwork which will be entered into your electronic medical record.  These signatures attest to the fact that that the information above on your After Visit Summary has been reviewed and is understood.  Full responsibility of the confidentiality of this discharge information lies with you and/or your care-partner.

## 2022-10-06 NOTE — Progress Notes (Unsigned)
Pt's states no medical or surgical changes since previsit or office visit. 

## 2022-10-06 NOTE — Progress Notes (Unsigned)
Called to room to assist during endoscopic procedure.  Patient ID and intended procedure confirmed with present staff. Received instructions for my participation in the procedure from the performing physician.  

## 2022-10-06 NOTE — Progress Notes (Unsigned)
Vss nad trans to pacu °

## 2022-10-06 NOTE — Progress Notes (Unsigned)
[facility] History and Physical   Primary Care Physician:  Dorothyann Peng, NP   Reason for Procedure:  Colorectal cancer screening  Plan:    Screening colonoscopy with possible interventions as needed     HPI: Shannon [name] is a very pleasant 71 y.o. female here for screening colonoscopy. Denies any nausea, vomiting, abdominal pain, melena or bright red blood per rectum  The risks and benefits as well as alternatives of endoscopic procedure(s) have been discussed and reviewed. All questions answered. The patient agrees to proceed.    Past Medical History:  Diagnosis Date   Allergy    Anemia    Bronchitis    in past   GERD (gastroesophageal reflux disease)    Headache(784.0)    Hypertension    Osteopenia     Past Surgical History:  Procedure Laterality Date   BREAST LUMPECTOMY     left breast /benign   LAPAROSCOPIC HYSTERECTOMY      Prior to Admission medications   Medication Sig Start Date End Date Taking? Authorizing Provider  losartan-hydrochlorothiazide (HYZAAR) 100-25 MG tablet Take 1 tablet by mouth daily. 04/27/22  Yes Nafziger, Tommi Rumps, NP  Nebivolol HCl 20 MG TABS TAKE 1 TABLET(20 MG) BY MOUTH DAILY 09/18/21  Yes Nafziger, Tommi Rumps, NP  omeprazole (PRILOSEC) 20 MG capsule Take 1 capsule (20 mg total) by mouth daily. 03/17/22  Yes Nafziger, Tommi Rumps, NP  mirtazapine (REMERON) 15 MG tablet Take 1 tablet (15 mg total) by mouth at bedtime. Patient not taking: Reported on 10/06/2022 07/22/22   Dorothyann Peng, NP  zolpidem (AMBIEN) 5 MG tablet Take 1 tablet (5 mg total) by mouth daily. Patient not taking: Reported on 09/14/2022 03/17/22   Dorothyann Peng, NP    Current Outpatient Medications  Medication Sig Dispense Refill   losartan-hydrochlorothiazide (HYZAAR) 100-25 MG tablet Take 1 tablet by mouth daily. 90 tablet 1   Nebivolol HCl 20 MG TABS TAKE 1 TABLET(20 MG) BY MOUTH DAILY 90 tablet 3   omeprazole (PRILOSEC) 20 MG capsule Take 1 capsule (20 mg total) by  mouth daily. 90 capsule 3   mirtazapine (REMERON) 15 MG tablet Take 1 tablet (15 mg total) by mouth at bedtime. (Patient not taking: Reported on 10/06/2022) 30 tablet 0   zolpidem (AMBIEN) 5 MG tablet Take 1 tablet (5 mg total) by mouth daily. (Patient not taking: Reported on 09/14/2022) 30 tablet 2   Current Facility-Administered Medications  Medication Dose Route Frequency Provider Last Rate Last Admin   0.9 %  sodium chloride infusion  500 mL Intravenous Once Mauri Pole, MD        Allergies as of 10/06/2022 - Review Complete 10/06/2022  Allergen Reaction Noted   Penicillins Shortness Of Breath    Corn-containing products Itching 05/25/2014    Family History  Problem Relation Age of Onset   Hypertension Mother        age 5   Alzheimer's disease Father        deceased secondary to alzheimer's disease   Colon cancer Neg Hx    Colon polyps Neg Hx    Crohn's disease Neg Hx    Esophageal cancer Neg Hx    Rectal cancer Neg Hx    Stomach cancer Neg Hx    Ulcerative colitis Neg Hx     Social History   Socioeconomic History   Marital status: Married    Spouse name: Not on file   Number of children: Not on file   Years of education: Not on  file   Highest education level: Not on file  Occupational History   Not on file  Tobacco Use   Smoking status: Former    Types: Cigarettes    Quit date: 01/31/2019    Years since quitting: 3.6    Passive exposure: Past (HUSBAND USE TO SMOKE)   Smokeless tobacco: Never  Vaping Use   Vaping Use: Never used  Substance and Sexual Activity   Alcohol use: Not Currently    Alcohol/week: 5.0 standard drinks of alcohol    Types: 5 Glasses of wine per week   Drug use: Yes    Types: Marijuana    Comment: "EVERY NOW AND THEN"   Sexual activity: Yes  Other Topics Concern   Not on file  Social History Narrative   Married - Husband Juanda Crumble)   Never Smoked    Alcohol use-no      Occupation -  Biochemist, 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[place])           Social Determinants of Health   Financial Resource Strain: [place]  (06/26/2022)   Overall Financial Resource Strain (CARDIA)    Difficulty of Paying Living Expenses: Not hard at all  Food Insecurity: No Food Insecurity (06/26/2022)   Hunger Vital Sign    Worried About Running Out of Food in the Last Year: Never true    Ran Out of Food in the Last Year: Never true  Transportation Needs: No Transportation Needs (06/26/2022)   PRAPARE - Hydrologist (Medical): No    Lack of Transportation (Non-Medical): No  Physical Activity: Insufficiently Active (06/26/2022)   Exercise Vital Sign    Days of Exercise per Week: 3 days    Minutes of Exercise per Session: 30 min  Stress: No Stress Concern Present (06/26/2022)   [place]    Feeling of Stress : Not at all  Social Connections: Moderately Integrated (06/26/2022)   Social Connection and Isolation Panel [NHANES]    Frequency of Communication with Friends and Family: More than three times a week    Frequency of Social Gatherings with Friends and Family: More than three times a week    Attends Religious Services: More than 4 times per year    Active Member of [employer] or Organizations: Yes    Attends Archivist Meetings: More than 4 times per year    Marital Status: Widowed  Intimate Partner Violence: Not At Risk (06/26/2022)   Humiliation, Afraid, Rape, and Kick questionnaire    Fear of Current or Ex-Partner: No    Emotionally Abused: No    Physically Abused: No    Sexually Abused: No    Review of Systems:  All other review of systems negative except as mentioned in the HPI.  Physical Exam: Vital signs in last 24 hours: Blood Pressure (Abnormal) 119/59   Pulse 65   Temperature (Abnormal) 96.9 F (36.1 C)   Height '5\' 7"'$  (1.702 m)   Weight 147 lb (66.7 kg)   Oxygen Saturation 99%   Body Mass  Index 23.02 kg/m  General:   Alert, NAD Lungs:  Clear .   Heart:  Regular rate and rhythm Abdomen:  Soft, nontender and nondistended. Neuro/Psych:  Alert and cooperative. Normal mood and affect. A and O x 3  Reviewed labs, radiology imaging, old records and pertinent past GI work up  Patient is appropriate for  planned procedure(s) and anesthesia in an ambulatory setting   K. Denzil Magnuson , MD [phone]

## 2022-10-06 NOTE — Op Note (Addendum)
[facility] Patient Name: Shannon [name] Procedure Date: 10/06/2022 2:26 PM MRN: [id number] Endoscopist: Mauri Pole , MD, [id number] Age: 71 Referring MD:  Date of Birth: Dec 05, 1950 Gender: Female Account #: [id number] Procedure:                Colonoscopy Indications:              Screening for colorectal malignant neoplasm Medicines:                Monitored Anesthesia Care Procedure:                Pre-Anesthesia Assessment:                           - Prior to the procedure, a History and Physical                            was performed, and patient medications and                            allergies were reviewed. The patient's tolerance of                            previous anesthesia was also reviewed. The risks                            and benefits of the procedure and the sedation                            options and risks were discussed with the patient.                            All questions were answered, and informed consent                            was obtained. Prior Anticoagulants: The patient has                            taken no anticoagulant or antiplatelet agents. ASA                            Grade Assessment: II - A patient with mild systemic                            disease. After reviewing the risks and benefits,                            the patient was deemed in satisfactory condition to                            undergo the procedure.                           After obtaining informed consent, the colonoscope  was passed under direct vision. Throughout the                            procedure, the patient's blood pressure, pulse, and                            oxygen saturations were monitored continuously. The                            Olympus PCF-[id number]) Colonoscope was                            introduced through the anus and advanced to the the                            cecum,  identified by appendiceal orifice and                            ileocecal valve. The colonoscopy was performed                            without difficulty. The patient tolerated the                            procedure well. The quality of the bowel                            preparation was good. The ileocecal valve,                            appendiceal orifice, and rectum were photographed. Scope In: 2:44:18 PM Scope Out: 3:10:27 PM Scope Withdrawal Time: 0 hours 9 minutes 39 seconds  Total Procedure Duration: 0 hours 26 minutes 9 seconds  Findings:                 The perianal and digital rectal examinations were                            normal.                           A 5 mm polyp was found in the sigmoid colon. The                            polyp was sessile. The polyp was removed with a                            cold snare. Resection and retrieval were complete.                           Scattered medium-mouthed and small-mouthed                            diverticula were found in the sigmoid colon,  descending colon, transverse colon and ascending                            colon. There was narrowing of the colon in                            association with the diverticular opening.                           Non-bleeding external and internal hemorrhoids were                            found during retroflexion. The hemorrhoids were                            medium-sized. Complications:            No immediate complications. Estimated Blood Loss:     Estimated blood loss was minimal. Impression:               - One 5 mm polyp in the sigmoid colon, removed with                            a cold snare. Resected and retrieved.                           - Severe diverticulosis in the sigmoid colon, in                            the descending colon, in the transverse colon and                            in the ascending colon. There was  narrowing of the                            colon in association with the diverticular opening.                           - Non-bleeding external and internal hemorrhoids. Recommendation:           - Patient has a contact number available for                            emergencies. The signs and symptoms of potential                            delayed complications were discussed with the                            patient. Return to normal activities tomorrow.                            Written discharge instructions were provided to the  patient.                           - Resume previous diet.                           - Continue present medications.                           - Await pathology results.                           - Repeat colonoscopy in 5-10 years for surveillance. Mauri Pole, MD 10/06/2022 3:15:47 PM This report has been signed electronically.

## 2022-10-07 ENCOUNTER — Encounter: Payer: Self-pay | Admitting: Adult Health

## 2022-10-07 ENCOUNTER — Ambulatory Visit (INDEPENDENT_AMBULATORY_CARE_PROVIDER_SITE_OTHER): Payer: Medicare Other | Admitting: Adult Health

## 2022-10-07 ENCOUNTER — Telehealth: Payer: Self-pay

## 2022-10-07 VITALS — BP 120/86 | HR 70 | Temp 98.1°F | Ht 64.0 in | Wt 140.0 lb

## 2022-10-07 DIAGNOSIS — E559 Vitamin D deficiency, unspecified: Secondary | ICD-10-CM | POA: Diagnosis not present

## 2022-10-07 DIAGNOSIS — M8580 Other specified disorders of bone density and structure, unspecified site: Secondary | ICD-10-CM | POA: Diagnosis not present

## 2022-10-07 DIAGNOSIS — Z23 Encounter for immunization: Secondary | ICD-10-CM

## 2022-10-07 DIAGNOSIS — Z780 Asymptomatic menopausal state: Secondary | ICD-10-CM

## 2022-10-07 DIAGNOSIS — I10 Essential (primary) hypertension: Secondary | ICD-10-CM | POA: Diagnosis not present

## 2022-10-07 DIAGNOSIS — Z13220 Encounter for screening for lipoid disorders: Secondary | ICD-10-CM | POA: Diagnosis not present

## 2022-10-07 DIAGNOSIS — K219 Gastro-esophageal reflux disease without esophagitis: Secondary | ICD-10-CM

## 2022-10-07 LAB — CBC WITH DIFFERENTIAL/PLATELET
Basophils Absolute: 0 10*3/uL (ref 0.0–0.1)
Basophils Relative: 0.5 % (ref 0.0–3.0)
Eosinophils Absolute: 0.1 10*3/uL (ref 0.0–0.7)
Eosinophils Relative: 0.7 % (ref 0.0–5.0)
HCT: 38.5 % (ref 36.0–46.0)
Hemoglobin: 13 g/dL (ref 12.0–15.0)
Lymphocytes Relative: 22.1 % (ref 12.0–46.0)
Lymphs Abs: 1.8 10*3/uL (ref 0.7–4.0)
MCHC: 33.8 g/dL (ref 30.0–36.0)
MCV: 91.3 fl (ref 78.0–100.0)
Monocytes Absolute: 0.6 10*3/uL (ref 0.1–1.0)
Monocytes Relative: 7.6 % (ref 3.0–12.0)
Neutro Abs: 5.6 10*3/uL (ref 1.4–7.7)
Neutrophils Relative %: 69.1 % (ref 43.0–77.0)
Platelets: 482 10*3/uL — ABNORMAL HIGH (ref 150.0–400.0)
RBC: 4.22 Mil/uL (ref 3.87–5.11)
RDW: 14.2 % (ref 11.5–15.5)
WBC: 8.1 10*3/uL (ref 4.0–10.5)

## 2022-10-07 LAB — COMPREHENSIVE METABOLIC PANEL
ALT: 9 U/L (ref 0–35)
AST: 12 U/L (ref 0–37)
Albumin: 4.3 g/dL (ref 3.5–5.2)
Alkaline Phosphatase: 103 U/L (ref 39–117)
BUN: 12 mg/dL (ref 6–23)
CO2: 29 mEq/L (ref 19–32)
Calcium: 11 mg/dL — ABNORMAL HIGH (ref 8.4–10.5)
Chloride: 102 mEq/L (ref 96–112)
Creatinine, Ser: 0.97 mg/dL (ref 0.40–1.20)
GFR: 59.41 mL/min — ABNORMAL LOW (ref >60.00)
Glucose, Bld: 100 mg/dL — ABNORMAL HIGH (ref 70–99)
Potassium: 3.7 mEq/L (ref 3.5–5.1)
Sodium: 138 mEq/L (ref 135–145)
Total Bilirubin: 0.5 mg/dL (ref 0.2–1.2)
Total Protein: 8.1 g/dL (ref 6.0–8.3)

## 2022-10-07 LAB — LIPID PANEL
Cholesterol: 185 mg/dL (ref 0–200)
HDL: 76.9 mg/dL (ref >39.00)
LDL Cholesterol: 89 mg/dL (ref 0–99)
NonHDL: 108.12
Total CHOL/HDL Ratio: 2
Triglycerides: 94 mg/dL (ref 0.0–149.0)
VLDL: 18.8 mg/dL (ref 0.0–40.0)

## 2022-10-07 LAB — TSH: TSH: 1.07 u[IU]/mL (ref 0.35–5.50)

## 2022-10-07 LAB — VITAMIN D 25 HYDROXY (VIT D DEFICIENCY, FRACTURES): VITD: 49.8 ng/mL (ref 30.00–100.00)

## 2022-10-07 NOTE — Telephone Encounter (Signed)
Left message on follow up call. 

## 2022-10-07 NOTE — Patient Instructions (Addendum)
It was great seeing you today   We will follow up with you regarding your lab work   Please let me know if you need anything   

## 2022-10-07 NOTE — Progress Notes (Signed)
Subjective:    Patient ID: Shannon [name], female    DOB: Jul 10, 1951, 71 y.o.   MRN: [id number]  HPI Patient presents for yearly preventative medicine examination. She is a pleasant 71 year old female who  has a past medical history of Allergy, Anemia, Bronchitis, GERD (gastroesophageal reflux disease), Headache(784.0), Hypertension, and Osteopenia.  Hypertension-managed with Hyzaar 100-25 mg and Nebivolol  20 mg daily.  She denies dizziness, lightheadedness, chest pain, shortness of breath, or syncopal episodes BP Readings from Last 3 Encounters:  10/07/22 120/86  10/06/22 (!) 145/67  09/03/22 (!) 186/80   Osteopenia -last bone density screen done in 03/2017 which showed osteopenia.   She has had an additional bone density screen ordered by GYN but has not completed this yet  GERD - takes prilosec 20 mg daily.   Vitamin D Deficiency - not currently taking any OTC vitamin D Last vitamin D Lab Results  Component Value Date   VD25OH 27.26 (L) 10/03/2019   All immunizations and health maintenance protocols were reviewed with the patient and needed orders were placed.  Appropriate screening laboratory values were ordered for the patient including screening of hyperlipidemia, renal function and hepatic function.   Medication reconciliation,  past medical history, social history, problem list and allergies were reviewed in detail with the patient  Goals were established with regard to weight loss, exercise, and  diet in compliance with medications She is up-to-date on her routine colonoscopy, this was done yesterday which showed one polyp   She has no acute complaints    Review of Systems  Constitutional: Negative.   HENT: Negative.    Eyes: Negative.   Respiratory: Negative.    Cardiovascular: Negative.   Gastrointestinal: Negative.   Endocrine: Negative.   Genitourinary: Negative.   Musculoskeletal: Negative.   Skin: Negative.   Allergic/Immunologic: Negative.    Neurological: Negative.   Hematological: Negative.    Past Medical History:  Diagnosis Date   Allergy    Anemia    Bronchitis    in past   GERD (gastroesophageal reflux disease)    Headache(784.0)    Hypertension    Osteopenia     Social History   Socioeconomic History   Marital status: Married    Spouse name: Not on file   Number of children: Not on file   Years of education: Not on file   Highest education level: Not on file  Occupational History   Not on file  Tobacco Use   Smoking status: Former    Types: Cigarettes    Quit date: 01/31/2019    Years since quitting: 3.6    Passive exposure: Past (HUSBAND USE TO SMOKE)   Smokeless tobacco: Never  Vaping Use   Vaping Use: Never used  Substance and Sexual Activity   Alcohol use: Not Currently    Alcohol/week: 5.0 standard drinks of alcohol    Types: 5 Glasses of wine per week   Drug use: Yes    Types: Marijuana    Comment: "EVERY NOW AND THEN"   Sexual activity: Yes  Other Topics Concern   Not on file  Social History Narrative   Married - Husband Juanda Crumble)   Never Smoked    Alcohol use-no      Occupation -  Biochemist, 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[place])           Social Determinants of Health   Financial Resource Strain: [place]  (06/26/2022)  Overall Financial Resource Strain (CARDIA)    Difficulty of Paying Living Expenses: Not hard at all  Food Insecurity: No Food Insecurity (06/26/2022)   Hunger Vital Sign    Worried About Running Out of Food in the Last Year: Never true    Ran Out of Food in the Last Year: Never true  Transportation Needs: No Transportation Needs (06/26/2022)   PRAPARE - Hydrologist (Medical): No    Lack of Transportation (Non-Medical): No  Physical Activity: Insufficiently Active (06/26/2022)   Exercise Vital Sign    Days of Exercise per Week: 3 days    Minutes of Exercise per Session: 30 min  Stress: No Stress Concern Present  (06/26/2022)   [place]    Feeling of Stress : Not at all  Social Connections: Moderately Integrated (06/26/2022)   Social Connection and Isolation Panel [NHANES]    Frequency of Communication with Friends and Family: More than three times a week    Frequency of Social Gatherings with Friends and Family: More than three times a week    Attends Religious Services: More than 4 times per year    Active Member of [employer] or Organizations: Yes    Attends Archivist Meetings: More than 4 times per year    Marital Status: Widowed  Intimate Partner Violence: Not At Risk (06/26/2022)   Humiliation, Afraid, Rape, and Kick questionnaire    Fear of Current or Ex-Partner: No    Emotionally Abused: No    Physically Abused: No    Sexually Abused: No    Past Surgical History:  Procedure Laterality Date   BREAST LUMPECTOMY     left breast /benign   LAPAROSCOPIC HYSTERECTOMY      Family History  Problem Relation Age of Onset   Hypertension Mother        age 37   Alzheimer's disease Father        deceased secondary to alzheimer's disease   Colon cancer Neg Hx    Colon polyps Neg Hx    Crohn's disease Neg Hx    Esophageal cancer Neg Hx    Rectal cancer Neg Hx    Stomach cancer Neg Hx    Ulcerative colitis Neg Hx     Allergies  Allergen Reactions   Penicillins Shortness Of Breath   Corn-Containing Products Itching    Current Outpatient Medications on File Prior to Visit  Medication Sig Dispense Refill   losartan-hydrochlorothiazide (HYZAAR) 100-25 MG tablet Take 1 tablet by mouth daily. 90 tablet 1   Nebivolol HCl 20 MG TABS TAKE 1 TABLET(20 MG) BY MOUTH DAILY 90 tablet 3   omeprazole (PRILOSEC) 20 MG capsule Take 1 capsule (20 mg total) by mouth daily. 90 capsule 3   No current facility-administered medications on file prior to visit.    BP 120/86   Pulse 70   Temp 98.1 F (36.7 C) (Oral)   Ht '5\' 4"'$   (1.626 m)   Wt 140 lb (63.5 kg)   SpO2 99%   BMI 24.03 kg/m       Objective:   Physical Exam Vitals and nursing note reviewed.  Constitutional:      General: She is not in acute distress.    Appearance: Normal appearance. She is well-developed. She is not ill-appearing.  HENT:     Head: Normocephalic and atraumatic.     Right Ear: Tympanic membrane, ear canal and external ear normal. There  is no impacted cerumen.     Left Ear: Tympanic membrane, ear canal and external ear normal. There is no impacted cerumen.     Nose: Nose normal. No congestion or rhinorrhea.     Mouth/Throat:     Mouth: Mucous membranes are moist.     Pharynx: Oropharynx is clear. No oropharyngeal exudate or posterior oropharyngeal erythema.  Eyes:     General:        Right eye: No discharge.        Left eye: No discharge.     Extraocular Movements: Extraocular movements intact.     Conjunctiva/sclera: Conjunctivae normal.     Pupils: Pupils are equal, round, and reactive to light.  Neck:     Thyroid: No thyromegaly.     Vascular: No carotid bruit.     Trachea: No tracheal deviation.  Cardiovascular:     Rate and Rhythm: Normal rate and regular rhythm.     Pulses: Normal pulses.     Heart sounds: Normal heart sounds. No murmur heard.    No friction rub. No gallop.  Pulmonary:     Effort: Pulmonary effort is normal. No respiratory distress.     Breath sounds: Normal breath sounds. No stridor. No wheezing, rhonchi or rales.  Chest:     Chest wall: No tenderness.  Abdominal:     General: Abdomen is flat. Bowel sounds are normal. There is no distension.     Palpations: Abdomen is soft. There is no mass.     Tenderness: There is no abdominal tenderness. There is no right CVA tenderness, left CVA tenderness, guarding or rebound.     Hernia: No hernia is present.  Musculoskeletal:        General: No swelling, tenderness, deformity or signs of injury. Normal range of motion.     Cervical back: Normal range  of motion and neck supple.     Right lower leg: No edema.     Left lower leg: No edema.  Lymphadenopathy:     Cervical: No cervical adenopathy.  Skin:    General: Skin is warm and dry.     Coloration: Skin is not jaundiced or pale.     Findings: No bruising, erythema, lesion or rash.  Neurological:     General: No focal deficit present.     Mental Status: She is alert and oriented to person, place, and time.     Cranial Nerves: No cranial nerve deficit.     Sensory: No sensory deficit.     Motor: No weakness.     Coordination: Coordination normal.     Gait: Gait normal.     Deep Tendon Reflexes: Reflexes normal.  Psychiatric:        Mood and Affect: Mood normal.        Behavior: Behavior normal.        Thought Content: Thought content normal.        Judgment: Judgment normal.       Assessment & Plan:  1. Essential hypertension - Well controlled. No change in medication  - Follow up in one year or sooner if needed - Eat healthy and exercise  - CBC with Differential/Platelet; Future - Comprehensive metabolic panel; Future - Lipid panel; Future - TSH; Future  2. Osteopenia after menopause - Follow up for bone density screening  - CBC with Differential/Platelet; Future - Comprehensive metabolic panel; Future - Lipid panel; Future - TSH; Future - VITAMIN D 25 Hydroxy (Vit-D Deficiency, Fractures); Future  3. Lipid screening - Consider statin  - CBC with Differential/Platelet; Future - Comprehensive metabolic panel; Future - Lipid panel; Future - TSH; Future  4. Vitamin D deficiency  - VITAMIN D 25 Hydroxy (Vit-D Deficiency, Fractures); Future  5. Gastroesophageal reflux disease without esophagitis - Continue PPI   6. Need for immunization against influenza  - Flu Vaccine QUAD High Dose(Fluad)   Dorothyann Peng, NP

## 2022-10-13 ENCOUNTER — Ambulatory Visit: Payer: Medicare Other

## 2022-10-15 ENCOUNTER — Encounter: Payer: Self-pay | Admitting: Gastroenterology

## 2022-11-28 ENCOUNTER — Other Ambulatory Visit: Payer: Self-pay | Admitting: Adult Health

## 2022-11-28 DIAGNOSIS — I10 Essential (primary) hypertension: Secondary | ICD-10-CM

## 2022-12-03 ENCOUNTER — Ambulatory Visit
Admission: RE | Admit: 2022-12-03 | Discharge: 2022-12-03 | Disposition: A | Discharge status: Home / Self Care | Payer: Medicare Other | Source: Ambulatory Visit | Attending: Adult Health | Admitting: Adult Health

## 2022-12-03 DIAGNOSIS — Z1231 Encounter for screening mammogram for malignant neoplasm of breast: Secondary | ICD-10-CM

## 2023-01-06 ENCOUNTER — Other Ambulatory Visit: Payer: Self-pay | Admitting: Adult Health

## 2023-01-06 DIAGNOSIS — I10 Essential (primary) hypertension: Secondary | ICD-10-CM

## 2023-04-07 ENCOUNTER — Inpatient Hospital Stay: Admission: RE | Admit: 2023-04-07 | Payer: Medicare Other | Source: Ambulatory Visit

## 2023-06-09 ENCOUNTER — Encounter (INDEPENDENT_AMBULATORY_CARE_PROVIDER_SITE_OTHER): Payer: Self-pay

## 2023-07-01 ENCOUNTER — Encounter: Payer: Medicare Other | Admitting: Family Medicine

## 2023-07-01 NOTE — Progress Notes (Signed)
Both assistant and myself tried to contact patient multiple times for appointment with no answer and no option for voicemail.

## 2023-08-04 ENCOUNTER — Other Ambulatory Visit: Payer: Self-pay | Admitting: Adult Health

## 2023-08-04 DIAGNOSIS — I10 Essential (primary) hypertension: Secondary | ICD-10-CM

## 2023-10-19 ENCOUNTER — Encounter: Payer: Self-pay | Admitting: Adult Health

## 2023-10-19 ENCOUNTER — Ambulatory Visit: Payer: Medicare Other

## 2023-10-19 ENCOUNTER — Ambulatory Visit (INDEPENDENT_AMBULATORY_CARE_PROVIDER_SITE_OTHER): Payer: Medicare Other | Admitting: Adult Health

## 2023-10-19 VITALS — BP 160/90 | HR 66 | Temp 98.1°F | Ht 64.0 in | Wt 154.0 lb

## 2023-10-19 DIAGNOSIS — R4189 Other symptoms and signs involving cognitive functions and awareness: Secondary | ICD-10-CM

## 2023-10-19 DIAGNOSIS — M25562 Pain in left knee: Secondary | ICD-10-CM

## 2023-10-19 DIAGNOSIS — S0990XA Unspecified injury of head, initial encounter: Secondary | ICD-10-CM | POA: Diagnosis not present

## 2023-10-19 DIAGNOSIS — I10 Essential (primary) hypertension: Secondary | ICD-10-CM

## 2023-10-19 MED ORDER — AMLODIPINE BESYLATE 5 MG PO TABS: 5.0000 mg | ORAL_TABLET | Freq: Every day | ORAL | 0 refills | Status: DC

## 2023-10-19 NOTE — Patient Instructions (Signed)
It was great seeing you today   I have ordered a CT of the head and an xray of the left knee   I have sent in Norvasc 5 mg to help bring down your blood pressure. Please follow up in 3-4 weeks for recheck

## 2023-10-19 NOTE — Progress Notes (Signed)
Subjective:    Patient ID: Shannon [name], female    DOB: [birth year], 72 y.o.   MRN: [id number]  HPI 72 year old female who  has a past medical history of Allergy, Anemia, Bronchitis, GERD (gastroesophageal reflux disease), Headache(784.0), Hypertension, and Osteopenia.  She presents to the office today after sustaining a mechanical fall.  She reports that she was at a local football game 4 days ago with her son, missed a step and fell down 3 stairs landing on her right side.  She does not remember hitting her head but she does have a bruise above her right eye.  The fall she has had pain to her left knee which has been consistent especially with walking.  Through the fall she also had pain to her right shoulder but this is since resolved.  She also had bruising on her toes on her right foot but the pain again has resolved.  He has not had any aches or blurred vision since the fall.  He also reports that over the last year she has noticed worsening cognitive impairment.  She continues to do her ADLs, has not been getting lost while driving, leaving the stove or sink on at home.  She just feels "a little bit more confused".  Denies any worsening confusion since the fall.  She denies UTI-like symptoms.  Furthermore in the office today her blood pressure is elevated.  She does report taking Hyzaar 100-25 mg daily and Nebivolol 20 mg daily   Review of Systems See HPI   . Past Medical History:  Diagnosis Date   Allergy    Anemia    Bronchitis    in past   GERD (gastroesophageal reflux disease)    Headache(784.0)    Hypertension    Osteopenia     Social History   Socioeconomic History   Marital status: Married    Spouse name: Not on file   Number of children: Not on file   Years of education: Not on file   Highest education level: Not on file  Occupational History   Not on file  Tobacco Use   Smoking status: Former    Current packs/day: 0.00    Types: Cigarettes    Quit  date: 01/31/2019    Years since quitting: 4.7    Passive exposure: Past (HUSBAND USE TO SMOKE)   Smokeless tobacco: Never  Vaping Use   Vaping status: Never Used  Substance and Sexual Activity   Alcohol use: Not Currently    Alcohol/week: 5.0 standard drinks of alcohol    Types: 5 Glasses of wine per week   Drug use: Yes    Types: Marijuana    Comment: "EVERY NOW AND THEN"   Sexual activity: Yes  Other Topics Concern   Not on file  Social History Narrative   Married - Husband Leonette Most)   Never Smoked    Alcohol use-no      Occupation -  Editor, 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[place])           Social Determinants of Health   Financial Resource Strain: Low Risk  (06/26/2022)   Overall Financial Resource Strain (CARDIA)    Difficulty of Paying Living Expenses: Not hard at all  Food Insecurity: No Food Insecurity (06/26/2022)   Hunger Vital Sign    Worried About Running Out of Food in the Last Year: Never true    Ran Out of Food in  the Last Year: Never true  Transportation Needs: No Transportation Needs (06/26/2022)   PRAPARE - Administrator, Civil Service (Medical): No    Lack of Transportation (Non-Medical): No  Physical Activity: Insufficiently Active (06/26/2022)   Exercise Vital Sign    Days of Exercise per Week: 3 days    Minutes of Exercise per Session: 30 min  Stress: No Stress Concern Present (06/26/2022)   [employer] of Occupational Health - Occupational Stress Questionnaire    Feeling of Stress : Not at all  Social Connections: Moderately Integrated (06/26/2022)   Social Connection and Isolation Panel [NHANES]    Frequency of Communication with Friends and Family: More than three times a week    Frequency of Social Gatherings with Friends and Family: More than three times a week    Attends Religious Services: More than 4 times per year    Active Member of [employer] or Organizations: Yes    Attends Banker Meetings: More  than 4 times per year    Marital Status: Widowed  Intimate Partner Violence: Not At Risk (06/26/2022)   Humiliation, Afraid, Rape, and Kick questionnaire    Fear of Current or Ex-Partner: No    Emotionally Abused: No    Physically Abused: No    Sexually Abused: No    Past Surgical History:  Procedure Laterality Date   BREAST LUMPECTOMY     left breast /benign   LAPAROSCOPIC HYSTERECTOMY      Family History  Problem Relation Age of Onset   Hypertension Mother        age 58   Alzheimer's disease Father        deceased secondary to alzheimer's disease   Colon cancer Neg Hx    Colon polyps Neg Hx    Crohn's disease Neg Hx    Esophageal cancer Neg Hx    Rectal cancer Neg Hx    Stomach cancer Neg Hx    Ulcerative colitis Neg Hx     Allergies  Allergen Reactions   Penicillins Shortness Of Breath   Corn-Containing Products Itching    Current Outpatient Medications on File Prior to Visit  Medication Sig Dispense Refill   losartan-hydrochlorothiazide (HYZAAR) 100-25 MG tablet TAKE 1 TABLET BY MOUTH DAILY 90 tablet 1   Nebivolol HCl 20 MG TABS TAKE 1 TABLET(20 MG) BY MOUTH DAILY 90 tablet 3   omeprazole (PRILOSEC) 20 MG capsule Take 1 capsule (20 mg total) by mouth daily. 90 capsule 3   No current facility-administered medications on file prior to visit.    BP (!) 160/90   Pulse 66   Temp 98.1 F (36.7 C) (Oral)   Ht 5\' 4"  (1.626 m)   Wt 154 lb (69.9 kg)   SpO2 100%   BMI 26.43 kg/m       Objective:   Physical Exam Vitals and nursing note reviewed.  Constitutional:      Appearance: Normal appearance.  HENT:     Head:      Right Ear: No hemotympanum.     Left Ear: No hemotympanum.  Eyes:     Extraocular Movements: Extraocular movements intact.     Conjunctiva/sclera: Conjunctivae normal.  Cardiovascular:     Rate and Rhythm: Normal rate.     Pulses: Normal pulses.     Heart sounds: Normal heart sounds.  Pulmonary:     Effort: Pulmonary effort is normal.      Breath sounds: Normal breath sounds.  Musculoskeletal:  General: Normal range of motion.     Right shoulder: Normal.     Left knee: Bony tenderness present. No swelling or crepitus. Normal range of motion. Normal alignment, normal meniscus and normal patellar mobility.     Right foot: Normal range of motion. No tenderness or bony tenderness.       Legs:  Skin:    General: Skin is warm and dry.  Neurological:     General: No focal deficit present.     Mental Status: She is oriented to person, place, and time.     Cranial Nerves: Cranial nerves 2-12 are intact.     Sensory: Sensation is intact.     Motor: Motor function is intact. No weakness.     Coordination: Coordination is intact.     Gait: Gait is intact.     Comments: No facial droop. Slurred speech or extremity weakness noted   Psychiatric:        Mood and Affect: Mood normal.        Speech: Speech normal.        Behavior: Behavior normal.        Thought Content: Thought content normal.        Cognition and Memory: Cognition is impaired. Memory is impaired.        Judgment: Judgment normal.        Assessment & Plan:  1. Acute pain of left knee  - DG Knee 1-2 Views Left; Future - Can use Tylenol and ice  2. Injury of head, initial encounter  - CT HEAD WO CONTRAST ( ); Future  3. Essential hypertension - Elevated in the office. Will add norvasc 5 mg - Follow up in 1 month  - amLODipine (NORVASC) 5 MG tablet; Take 1 tablet (5 mg total) by mouth daily.  Dispense: 90 tablet; Refill: 0  4. Cognitive impairment - Very mild cognitive impairment noted today  - No CVA like symptoms.  - Will get CT head and consider MRI in the future  - Follow up for more formal testing  Shirline Frees, NP  Time spent with patient today was 40 minutes which consisted of chart review, discussing Acute pain in left knee, head injury, essential hypertension, and cognitive impairment. , work up, treatment answering questions and  documentation.

## 2023-10-20 ENCOUNTER — Ambulatory Visit
Admission: RE | Admit: 2023-10-20 | Discharge: 2023-10-20 | Disposition: A | Discharge status: Home / Self Care | Payer: Medicare Other | Source: Ambulatory Visit | Attending: Adult Health | Admitting: Adult Health

## 2023-10-20 ENCOUNTER — Ambulatory Visit: Payer: Medicare Other | Admitting: Adult Health

## 2023-10-20 DIAGNOSIS — S0990XA Unspecified injury of head, initial encounter: Secondary | ICD-10-CM

## 2023-10-21 ENCOUNTER — Other Ambulatory Visit: Payer: Self-pay | Admitting: Adult Health

## 2023-10-21 DIAGNOSIS — R4189 Other symptoms and signs involving cognitive functions and awareness: Secondary | ICD-10-CM

## 2023-11-06 ENCOUNTER — Ambulatory Visit
Admission: RE | Admit: 2023-11-06 | Discharge: 2023-11-06 | Disposition: A | Discharge status: Home / Self Care | Payer: Medicare Other | Source: Ambulatory Visit | Attending: Adult Health | Admitting: Adult Health

## 2023-11-06 DIAGNOSIS — R4189 Other symptoms and signs involving cognitive functions and awareness: Secondary | ICD-10-CM

## 2023-11-17 ENCOUNTER — Ambulatory Visit: Payer: Medicare Other | Admitting: Adult Health

## 2023-11-17 NOTE — Progress Notes (Deleted)
 Subjective:    Patient ID: Shannon [name], female    DOB: Apr 01, 1951, 73 y.o.   MRN: [id number]  HPI 73 year old female who  has a past medical history of Allergy, Anemia, Bronchitis, GERD (gastroesophageal reflux disease), Headache(784.0), Hypertension, and Osteopenia.  She presents to the office today for 1 month follow-up regarding hypertension.  He was last seen in the office a month ago her blood pressure was elevated despite taking Hyzaar 100-25 mg daily and Nebivolol  20 mg daily.  Norvasc  5 mg daily was added to her regimen.  Since starting Norvasc  she has not developed any chest pain, shortness of breath, lightheadedness, dizziness, or lower extremity edema  BP Readings from Last 3 Encounters:  10/19/23 (!) 160/90  10/07/22 120/86  10/06/22 (!) 145/67      Review of Systems  See HPI   Past Medical History:  Diagnosis Date   Allergy    Anemia    Bronchitis    in past   GERD (gastroesophageal reflux disease)    Headache(784.0)    Hypertension    Osteopenia     Social History   Socioeconomic History   Marital status: Married    Spouse name: Not on file   Number of children: Not on file   Years of education: Not on file   Highest education level: Not on file  Occupational History   Not on file  Tobacco Use   Smoking status: Former    Current packs/day: 0.00    Types: Cigarettes    Quit date: 01/31/2019    Years since quitting: 4.7    Passive exposure: Past (HUSBAND USE TO SMOKE)   Smokeless tobacco: Never  Vaping Use   Vaping status: Never Used  Substance and Sexual Activity   Alcohol use: Not Currently    Alcohol/week: 5.0 standard drinks of alcohol    Types: 5 Glasses of wine per week   Drug use: Yes    Types: Marijuana    Comment: EVERY NOW AND THEN   Sexual activity: Yes  Other Topics Concern   Not on file  Social History Narrative   Married - Husband French)   Never Smoked    Alcohol use-no      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[place] )           Social Drivers of Health   Financial Resource Strain: Low Risk  (06/26/2022)   Overall Financial Resource Strain (CARDIA)    Difficulty of Paying Living Expenses: Not hard at all  Food Insecurity: No Food Insecurity (06/26/2022)   Hunger Vital Sign    Worried About Running Out of Food in the Last Year: Never true    Ran Out of Food in the Last Year: Never true  Transportation Needs: No Transportation Needs (06/26/2022)   PRAPARE - Administrator, Civil Service (Medical): No    Lack of Transportation (Non-Medical): No  Physical Activity: Insufficiently Active (06/26/2022)   Exercise Vital Sign    Days of Exercise per Week: 3 days    Minutes of Exercise per Session: 30 min  Stress: No Stress Concern Present (06/26/2022)   [employer] of Occupational Health - Occupational Stress Questionnaire    Feeling of Stress : Not at all  Social Connections: Moderately Integrated (06/26/2022)   Social Connection and Isolation Panel [NHANES]    Frequency of Communication with Friends and Family: More than three  times a week    Frequency of Social Gatherings with Friends and Family: More than three times a week    Attends Religious Services: More than 4 times per year    Active Member of Clubs or Organizations: Yes    Attends Banker Meetings: More than 4 times per year    Marital Status: Widowed  Intimate Partner Violence: Not At Risk (06/26/2022)   Humiliation, Afraid, Rape, and Kick questionnaire    Fear of Current or Ex-Partner: No    Emotionally Abused: No    Physically Abused: No    Sexually Abused: No    Past Surgical History:  Procedure Laterality Date   BREAST LUMPECTOMY     left breast /benign   LAPAROSCOPIC HYSTERECTOMY      Family History  Problem Relation Age of Onset   Hypertension Mother        age 48   Alzheimer's disease Father        deceased secondary to alzheimer's disease   Colon cancer  Neg Hx    Colon polyps Neg Hx    Crohn's disease Neg Hx    Esophageal cancer Neg Hx    Rectal cancer Neg Hx    Stomach cancer Neg Hx    Ulcerative colitis Neg Hx     Allergies  Allergen Reactions   Penicillins Shortness Of Breath   Corn-Containing Products Itching    Current Outpatient Medications on File Prior to Visit  Medication Sig Dispense Refill   amLODipine  (NORVASC ) 5 MG tablet Take 1 tablet (5 mg total) by mouth daily. 90 tablet 0   losartan -hydrochlorothiazide  (HYZAAR) 100-25 MG tablet TAKE 1 TABLET BY MOUTH DAILY 90 tablet 1   Nebivolol  HCl 20 MG TABS TAKE 1 TABLET(20 MG) BY MOUTH DAILY 90 tablet 3   omeprazole  (PRILOSEC) 20 MG capsule Take 1 capsule (20 mg total) by mouth daily. 90 capsule 3   No current facility-administered medications on file prior to visit.    There were no vitals taken for this visit.      Objective:   Physical Exam        Assessment & Plan:

## 2023-11-24 ENCOUNTER — Ambulatory Visit: Payer: Medicare Other | Admitting: Adult Health

## 2023-12-01 ENCOUNTER — Ambulatory Visit: Payer: Medicare Other | Admitting: Adult Health

## 2023-12-01 ENCOUNTER — Encounter: Payer: Self-pay | Admitting: Adult Health

## 2023-12-01 VITALS — BP 138/80 | HR 83 | Temp 98.4°F | Ht 64.0 in | Wt 152.0 lb

## 2023-12-01 DIAGNOSIS — I10 Essential (primary) hypertension: Secondary | ICD-10-CM | POA: Diagnosis not present

## 2023-12-01 DIAGNOSIS — G479 Sleep disorder, unspecified: Secondary | ICD-10-CM | POA: Diagnosis not present

## 2023-12-01 MED ORDER — TRAZODONE HCL 50 MG PO TABS: 25.0000 mg | ORAL_TABLET | Freq: Every evening | ORAL | 0 refills | Status: DC | PRN

## 2023-12-01 NOTE — Progress Notes (Signed)
Subjective:    Patient ID: Shannon [name], female    DOB: Dec 28, 1950, 73 y.o.   MRN: [id number]  Head Injury    73 year old female who  has a past medical history of Allergy, Anemia, Bronchitis, GERD (gastroesophageal reflux disease), Headache(784.0), Hypertension, and Osteopenia.  She presents to the office today for follow up regarding hypertension and insomnia   During her last visit er blood pressure was elevated despite taking Hyzaar 100-25 mg and Nebivolol 20 mg. Norvasc 5 mg was added to her regimen. She has been checking her BP at home with readings in the 120's/70's. She has not had any lightheadedness, blurred vision or dizziness.   BP Readings from Last 3 Encounters:  12/01/23 138/80  10/19/23 (!) 160/90  10/07/22 120/86     She also has a long history of insomnia from working third shift for a number of years. She has been on multiple medications throughout the years including Ambien and Remeron. She is no longer taking any medication and is having trouble fallings asleep and staying asleep. She would like to try something new to help her sleep   Review of Systems See HPI   Past Medical History:  Diagnosis Date   Allergy    Anemia    Bronchitis    in past   GERD (gastroesophageal reflux disease)    Headache(784.0)    Hypertension    Osteopenia     Social History   Socioeconomic History   Marital status: Married    Spouse name: Not on file   Number of children: Not on file   Years of education: Not on file   Highest education level: Not on file  Occupational History   Not on file  Tobacco Use   Smoking status: Former    Current packs/day: 0.00    Types: Cigarettes    Quit date: 01/31/2019    Years since quitting: 4.8    Passive exposure: Past (HUSBAND USE TO SMOKE)   Smokeless tobacco: Never  Vaping Use   Vaping status: Never Used  Substance and Sexual Activity   Alcohol use: Not Currently    Alcohol/week: 5.0 standard drinks of alcohol     Types: 5 Glasses of wine per week   Drug use: Yes    Types: Marijuana    Comment: "EVERY NOW AND THEN"   Sexual activity: Yes  Other Topics Concern   Not on file  Social History Narrative   Married - Husband Leonette Most)   Never Smoked    Alcohol use-no      Occupation -  Editor, 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[place])           Social Drivers of Corporate investment banker Strain: Low Risk  (06/26/2022)   Overall Financial Resource Strain (CARDIA)    Difficulty of Paying Living Expenses: Not hard at all  Food Insecurity: No Food Insecurity (06/26/2022)   Hunger Vital Sign    Worried About Running Out of Food in the Last Year: Never true    Ran Out of Food in the Last Year: Never true  Transportation Needs: No Transportation Needs (06/26/2022)   PRAPARE - Administrator, Civil Service (Medical): No    Lack of Transportation (Non-Medical): No  Physical Activity: Insufficiently Active (06/26/2022)   Exercise Vital Sign    Days of Exercise per Week: 3 days    Minutes of Exercise per Session: 30  min  Stress: No Stress Concern Present (06/26/2022)   [employer] of Occupational Health - Occupational Stress Questionnaire    Feeling of Stress : Not at all  Social Connections: Moderately Integrated (06/26/2022)   Social Connection and Isolation Panel [NHANES]    Frequency of Communication with Friends and Family: More than three times a week    Frequency of Social Gatherings with Friends and Family: More than three times a week    Attends Religious Services: More than 4 times per year    Active Member of [employer] or Organizations: Yes    Attends Banker Meetings: More than 4 times per year    Marital Status: Widowed  Intimate Partner Violence: Not At Risk (06/26/2022)   Humiliation, Afraid, Rape, and Kick questionnaire    Fear of Current or Ex-Partner: No    Emotionally Abused: No    Physically Abused: No    Sexually Abused: No    Past  Surgical History:  Procedure Laterality Date   BREAST LUMPECTOMY     left breast /benign   LAPAROSCOPIC HYSTERECTOMY      Family History  Problem Relation Age of Onset   Hypertension Mother        age 81   Alzheimer's disease Father        deceased secondary to alzheimer's disease   Colon cancer Neg Hx    Colon polyps Neg Hx    Crohn's disease Neg Hx    Esophageal cancer Neg Hx    Rectal cancer Neg Hx    Stomach cancer Neg Hx    Ulcerative colitis Neg Hx     Allergies  Allergen Reactions   Penicillins Shortness Of Breath   Corn-Containing Products Itching    Current Outpatient Medications on File Prior to Visit  Medication Sig Dispense Refill   amLODipine (NORVASC) 5 MG tablet Take 1 tablet (5 mg total) by mouth daily. 90 tablet 0   losartan-hydrochlorothiazide (HYZAAR) 100-25 MG tablet TAKE 1 TABLET BY MOUTH DAILY 90 tablet 1   Nebivolol HCl 20 MG TABS TAKE 1 TABLET(20 MG) BY MOUTH DAILY 90 tablet 3   omeprazole (PRILOSEC) 20 MG capsule Take 1 capsule (20 mg total) by mouth daily. 90 capsule 3   No current facility-administered medications on file prior to visit.    BP 138/80   Pulse 83   Temp 98.4 F (36.9 C) (Oral)   Ht 5\' 4"  (1.626 m)   Wt 152 lb (68.9 kg)   SpO2 97%   BMI 26.09 kg/m       Objective:   Physical Exam Vitals and nursing note reviewed.  Constitutional:      Appearance: Normal appearance.  Cardiovascular:     Rate and Rhythm: Normal rate and regular rhythm.     Pulses: Normal pulses.     Heart sounds: Normal heart sounds.  Pulmonary:     Effort: Pulmonary effort is normal.     Breath sounds: Normal breath sounds.  Musculoskeletal:        General: Normal range of motion.  Skin:    General: Skin is warm and dry.  Neurological:     General: No focal deficit present.     Mental Status: She is alert and oriented to person, place, and time.  Psychiatric:        Mood and Affect: Mood normal.        Behavior: Behavior normal.         Thought Content: Thought content normal.  Judgment: Judgment normal.       Assessment & Plan:  1. Essential hypertension (Primary) - Better controlled.  - Continue with current therapy   2. Sleep disturbance - Will trial her on Trazadone. Start with 1/2tab and can increase to 50 mg if needed. Follow up if not effective  - traZODone (DESYREL) 50 MG tablet; Take 0.5-1 tablets (25-50 mg total) by mouth at bedtime as needed for sleep.  Dispense: 90 tablet; Refill: 0  Shirline Frees, NP  Time spent with patient today was 32 minutes which consisted of chart review, discussing HTN and Insomnia, work up, treatment answering questions and documentation.

## 2024-01-21 ENCOUNTER — Other Ambulatory Visit: Payer: Self-pay | Admitting: Adult Health

## 2024-01-21 DIAGNOSIS — I10 Essential (primary) hypertension: Secondary | ICD-10-CM

## 2024-04-12 ENCOUNTER — Ambulatory Visit: Payer: Medicare Other

## 2024-05-22 ENCOUNTER — Other Ambulatory Visit: Payer: Self-pay | Admitting: Adult Health

## 2024-05-22 DIAGNOSIS — G479 Sleep disorder, unspecified: Secondary | ICD-10-CM

## 2024-05-22 DIAGNOSIS — I10 Essential (primary) hypertension: Secondary | ICD-10-CM

## 2024-06-16 DIAGNOSIS — Z136 Encounter for screening for cardiovascular disorders: Secondary | ICD-10-CM | POA: Diagnosis not present

## 2024-06-16 DIAGNOSIS — Z79899 Other long term (current) drug therapy: Secondary | ICD-10-CM | POA: Diagnosis not present

## 2024-06-16 DIAGNOSIS — M1712 Unilateral primary osteoarthritis, left knee: Secondary | ICD-10-CM | POA: Diagnosis not present

## 2024-06-16 DIAGNOSIS — M8580 Other specified disorders of bone density and structure, unspecified site: Secondary | ICD-10-CM | POA: Diagnosis not present

## 2024-06-16 DIAGNOSIS — E559 Vitamin D deficiency, unspecified: Secondary | ICD-10-CM | POA: Diagnosis not present

## 2024-06-16 DIAGNOSIS — G4700 Insomnia, unspecified: Secondary | ICD-10-CM | POA: Diagnosis not present

## 2024-06-16 DIAGNOSIS — I10 Essential (primary) hypertension: Secondary | ICD-10-CM | POA: Diagnosis not present

## 2024-06-16 DIAGNOSIS — Z0000 Encounter for general adult medical examination without abnormal findings: Secondary | ICD-10-CM | POA: Diagnosis not present

## 2024-07-04 DIAGNOSIS — Z1389 Encounter for screening for other disorder: Secondary | ICD-10-CM | POA: Diagnosis not present

## 2024-07-04 DIAGNOSIS — Z0001 Encounter for general adult medical examination with abnormal findings: Secondary | ICD-10-CM | POA: Diagnosis not present

## 2024-07-04 DIAGNOSIS — K648 Other hemorrhoids: Secondary | ICD-10-CM | POA: Diagnosis not present

## 2024-07-04 DIAGNOSIS — Z720 Tobacco use: Secondary | ICD-10-CM | POA: Diagnosis not present

## 2024-07-04 DIAGNOSIS — H18419 Arcus senilis, unspecified eye: Secondary | ICD-10-CM | POA: Diagnosis not present

## 2024-10-23 ENCOUNTER — Telehealth: Payer: Self-pay

## 2024-10-23 ENCOUNTER — Ambulatory Visit

## 2024-10-23 NOTE — Telephone Encounter (Signed)
 Unsuccessful attempts to reach patient on preferred number listed in notes for scheduled AWV. Left message on voicemail okay to reschedule.
